# Patient Record
Sex: Female | Born: 1935 | Race: White | Hispanic: No | Smoking: Never smoker
Health system: Southern US, Community
[De-identification: ages and names within clinical notes are randomized; demographics above are authoritative.]

## PROBLEM LIST (undated history)

## (undated) DIAGNOSIS — N63 Unspecified lump in unspecified breast: Secondary | ICD-10-CM

## (undated) DIAGNOSIS — K449 Diaphragmatic hernia without obstruction or gangrene: Secondary | ICD-10-CM

## (undated) DIAGNOSIS — R12 Heartburn: Secondary | ICD-10-CM

## (undated) DIAGNOSIS — I509 Heart failure, unspecified: Secondary | ICD-10-CM

## (undated) DIAGNOSIS — M1990 Unspecified osteoarthritis, unspecified site: Secondary | ICD-10-CM

## (undated) DIAGNOSIS — I10 Essential (primary) hypertension: Secondary | ICD-10-CM

## (undated) DIAGNOSIS — C50919 Malignant neoplasm of unspecified site of unspecified female breast: Secondary | ICD-10-CM

## (undated) HISTORY — DX: Essential (primary) hypertension: I10

## (undated) HISTORY — PX: CHOLECYSTECTOMY: SHX55

## (undated) HISTORY — DX: Heartburn: R12

## (undated) HISTORY — DX: Unspecified osteoarthritis, unspecified site: M19.90

---

## 1978-12-31 HISTORY — PX: BREAST BIOPSY: SHX20

## 2004-05-09 ENCOUNTER — Ambulatory Visit: Payer: Self-pay | Admitting: Family Medicine

## 2005-07-25 ENCOUNTER — Ambulatory Visit: Payer: Self-pay | Admitting: Family Medicine

## 2006-07-31 ENCOUNTER — Ambulatory Visit: Payer: Self-pay | Admitting: Family Medicine

## 2007-05-02 DIAGNOSIS — C50919 Malignant neoplasm of unspecified site of unspecified female breast: Secondary | ICD-10-CM

## 2007-05-02 HISTORY — PX: MASTECTOMY: SHX3

## 2007-05-02 HISTORY — DX: Malignant neoplasm of unspecified site of unspecified female breast: C50.919

## 2007-11-12 ENCOUNTER — Ambulatory Visit: Payer: Self-pay | Admitting: Family Medicine

## 2007-11-30 ENCOUNTER — Ambulatory Visit: Payer: Self-pay | Admitting: Oncology

## 2007-12-09 ENCOUNTER — Ambulatory Visit: Payer: Self-pay | Admitting: Oncology

## 2007-12-13 ENCOUNTER — Ambulatory Visit: Payer: Self-pay | Admitting: Surgery

## 2007-12-13 ENCOUNTER — Other Ambulatory Visit: Payer: Self-pay

## 2007-12-17 ENCOUNTER — Inpatient Hospital Stay: Payer: Self-pay | Admitting: Surgery

## 2007-12-27 ENCOUNTER — Ambulatory Visit: Payer: Self-pay | Admitting: Oncology

## 2007-12-31 ENCOUNTER — Ambulatory Visit: Payer: Self-pay | Admitting: Oncology

## 2008-01-30 ENCOUNTER — Ambulatory Visit: Payer: Self-pay | Admitting: Oncology

## 2008-03-01 ENCOUNTER — Ambulatory Visit: Payer: Self-pay | Admitting: Oncology

## 2008-03-24 ENCOUNTER — Ambulatory Visit: Payer: Self-pay | Admitting: Oncology

## 2008-03-31 ENCOUNTER — Ambulatory Visit: Payer: Self-pay | Admitting: Oncology

## 2008-05-08 ENCOUNTER — Ambulatory Visit: Payer: Self-pay | Admitting: Oncology

## 2008-06-16 ENCOUNTER — Ambulatory Visit: Payer: Self-pay | Admitting: Oncology

## 2008-06-29 ENCOUNTER — Ambulatory Visit: Payer: Self-pay | Admitting: Oncology

## 2008-11-29 ENCOUNTER — Ambulatory Visit: Payer: Self-pay | Admitting: Oncology

## 2008-12-01 ENCOUNTER — Ambulatory Visit: Payer: Self-pay | Admitting: Oncology

## 2008-12-30 ENCOUNTER — Ambulatory Visit: Payer: Self-pay | Admitting: Oncology

## 2009-05-01 ENCOUNTER — Ambulatory Visit: Payer: Self-pay | Admitting: Oncology

## 2009-05-03 ENCOUNTER — Ambulatory Visit: Payer: Self-pay | Admitting: Oncology

## 2009-06-01 ENCOUNTER — Ambulatory Visit: Payer: Self-pay | Admitting: Oncology

## 2009-06-29 ENCOUNTER — Ambulatory Visit: Payer: Self-pay | Admitting: Oncology

## 2009-10-25 ENCOUNTER — Ambulatory Visit: Payer: Self-pay

## 2009-11-29 ENCOUNTER — Ambulatory Visit: Payer: Self-pay | Admitting: Oncology

## 2009-11-30 ENCOUNTER — Ambulatory Visit: Payer: Self-pay | Admitting: Oncology

## 2009-12-02 LAB — CANCER ANTIGEN 27.29: CA 27.29: 31.5 U/mL (ref 0.0–38.6)

## 2009-12-30 ENCOUNTER — Ambulatory Visit: Payer: Self-pay | Admitting: Oncology

## 2010-06-01 ENCOUNTER — Ambulatory Visit: Payer: Self-pay | Admitting: Oncology

## 2010-06-22 ENCOUNTER — Ambulatory Visit: Payer: Self-pay | Admitting: Oncology

## 2010-06-30 ENCOUNTER — Ambulatory Visit: Payer: Self-pay | Admitting: Oncology

## 2010-07-31 ENCOUNTER — Ambulatory Visit: Payer: Self-pay | Admitting: Oncology

## 2010-10-25 ENCOUNTER — Ambulatory Visit: Payer: Self-pay | Admitting: Oncology

## 2010-10-30 ENCOUNTER — Ambulatory Visit: Payer: Self-pay | Admitting: Internal Medicine

## 2010-10-30 ENCOUNTER — Ambulatory Visit: Payer: Self-pay | Admitting: Oncology

## 2010-11-30 ENCOUNTER — Ambulatory Visit: Payer: Self-pay | Admitting: Oncology

## 2010-11-30 ENCOUNTER — Ambulatory Visit: Payer: Self-pay | Admitting: Internal Medicine

## 2011-01-04 ENCOUNTER — Ambulatory Visit: Payer: Self-pay | Admitting: Oncology

## 2011-01-16 ENCOUNTER — Emergency Department: Payer: Self-pay | Admitting: Emergency Medicine

## 2011-01-30 ENCOUNTER — Ambulatory Visit: Payer: Self-pay | Admitting: Oncology

## 2011-04-01 ENCOUNTER — Emergency Department: Payer: Self-pay | Admitting: *Deleted

## 2011-04-14 ENCOUNTER — Ambulatory Visit: Payer: Self-pay | Admitting: Oncology

## 2011-04-15 LAB — CANCER ANTIGEN 27.29: CA 27.29: 42.5 U/mL — ABNORMAL HIGH (ref 0.0–38.6)

## 2011-05-02 ENCOUNTER — Ambulatory Visit: Payer: Self-pay | Admitting: Oncology

## 2011-07-07 ENCOUNTER — Ambulatory Visit: Payer: Self-pay | Admitting: Oncology

## 2011-08-04 ENCOUNTER — Ambulatory Visit: Payer: Self-pay | Admitting: Oncology

## 2011-08-15 LAB — RETICULOCYTES: Absolute Retic Count: 0.0504 10*6/uL (ref 0.024–0.084)

## 2011-08-15 LAB — IRON AND TIBC
Iron Saturation: 6 %
Iron: 32 ug/dL — ABNORMAL LOW (ref 50–170)
Unbound Iron-Bind.Cap.: 484 ug/dL

## 2011-08-30 ENCOUNTER — Ambulatory Visit: Payer: Self-pay | Admitting: Oncology

## 2011-10-13 ENCOUNTER — Ambulatory Visit: Payer: Self-pay | Admitting: Oncology

## 2011-11-03 ENCOUNTER — Ambulatory Visit: Payer: Self-pay | Admitting: Oncology

## 2011-11-03 LAB — CBC CANCER CENTER
Basophil #: 0 x10 3/mm (ref 0.0–0.1)
Basophil %: 0.6 %
Eosinophil #: 0.2 x10 3/mm (ref 0.0–0.7)
HCT: 35.2 % (ref 35.0–47.0)
HGB: 11 g/dL — ABNORMAL LOW (ref 12.0–16.0)
Lymphocyte %: 25.8 %
MCH: 23.9 pg — ABNORMAL LOW (ref 26.0–34.0)
MCHC: 31.2 g/dL — ABNORMAL LOW (ref 32.0–36.0)
Monocyte #: 0.4 x10 3/mm (ref 0.2–0.9)
Monocyte %: 6.7 %
Neutrophil #: 3.9 x10 3/mm (ref 1.4–6.5)
Neutrophil %: 63.2 %
Platelet: 295 x10 3/mm (ref 150–440)
RBC: 4.59 10*6/uL (ref 3.80–5.20)
RDW: 19.4 % — ABNORMAL HIGH (ref 11.5–14.5)
WBC: 6.1 x10 3/mm (ref 3.6–11.0)

## 2011-11-03 LAB — COMPREHENSIVE METABOLIC PANEL
Anion Gap: 10 (ref 7–16)
Calcium, Total: 9.5 mg/dL (ref 8.5–10.1)
Chloride: 104 mmol/L (ref 98–107)
EGFR (African American): >60
EGFR (Non-African Amer.): >60
Potassium: 3.3 mmol/L — ABNORMAL LOW (ref 3.5–5.1)
SGOT(AST): 23 U/L (ref 15–37)
Total Protein: 7.3 g/dL (ref 6.4–8.2)

## 2011-11-30 ENCOUNTER — Ambulatory Visit: Payer: Self-pay | Admitting: Oncology

## 2012-03-01 ENCOUNTER — Ambulatory Visit: Payer: Self-pay | Admitting: Oncology

## 2012-03-01 LAB — CBC CANCER CENTER
Basophil %: 0.7 %
Eosinophil #: 0.1 x10 3/mm (ref 0.0–0.7)
Eosinophil %: 1.3 %
HCT: 35.5 % (ref 35.0–47.0)
HGB: 11.5 g/dL — ABNORMAL LOW (ref 12.0–16.0)
Lymphocyte #: 2 x10 3/mm (ref 1.0–3.6)
Lymphocyte %: 30.3 %
MCH: 25.5 pg — ABNORMAL LOW (ref 26.0–34.0)
MCHC: 32.3 g/dL (ref 32.0–36.0)
MCV: 79 fL — ABNORMAL LOW (ref 80–100)
Monocyte #: 0.5 x10 3/mm (ref 0.2–0.9)
Neutrophil #: 4 x10 3/mm (ref 1.4–6.5)
Neutrophil %: 60 %
Platelet: 298 x10 3/mm (ref 150–440)
RBC: 4.5 10*6/uL (ref 3.80–5.20)
RDW: 19 % — ABNORMAL HIGH (ref 11.5–14.5)
WBC: 6.6 x10 3/mm (ref 3.6–11.0)

## 2012-03-01 LAB — COMPREHENSIVE METABOLIC PANEL
Albumin: 4.3 g/dL (ref 3.4–5.0)
Alkaline Phosphatase: 124 U/L (ref 50–136)
Anion Gap: 12 (ref 7–16)
Calcium, Total: 9.9 mg/dL (ref 8.5–10.1)
Chloride: 103 mmol/L (ref 98–107)
Co2: 28 mmol/L (ref 21–32)
Creatinine: 0.85 mg/dL (ref 0.60–1.30)
EGFR (African American): >60
EGFR (Non-African Amer.): >60
Potassium: 3.5 mmol/L (ref 3.5–5.1)
SGOT(AST): 22 U/L (ref 15–37)
SGPT (ALT): 25 U/L (ref 12–78)
Sodium: 143 mmol/L (ref 136–145)
Total Protein: 7.7 g/dL (ref 6.4–8.2)

## 2012-03-04 LAB — CANCER ANTIGEN 27.29: CA 27.29: 38 U/mL (ref 0.0–38.6)

## 2012-03-31 ENCOUNTER — Ambulatory Visit: Payer: Self-pay | Admitting: Oncology

## 2012-05-31 ENCOUNTER — Ambulatory Visit: Payer: Self-pay | Admitting: Oncology

## 2012-05-31 LAB — COMPREHENSIVE METABOLIC PANEL
Alkaline Phosphatase: 103 U/L (ref 50–136)
Chloride: 104 mmol/L (ref 98–107)
Co2: 27 mmol/L (ref 21–32)
Creatinine: 0.9 mg/dL (ref 0.60–1.30)
EGFR (African American): >60
EGFR (Non-African Amer.): 57 — ABNORMAL LOW
Glucose: 153 mg/dL — ABNORMAL HIGH (ref 65–99)
Potassium: 3.4 mmol/L — ABNORMAL LOW (ref 3.5–5.1)
SGPT (ALT): 21 U/L (ref 12–78)
Sodium: 144 mmol/L (ref 136–145)

## 2012-05-31 LAB — CBC CANCER CENTER
Basophil %: 0.6 %
Eosinophil #: 0.1 x10 3/mm (ref 0.0–0.7)
MCH: 23.5 pg — ABNORMAL LOW (ref 26.0–34.0)
MCHC: 31.6 g/dL — ABNORMAL LOW (ref 32.0–36.0)
MCV: 74 fL — ABNORMAL LOW (ref 80–100)
Monocyte #: 0.5 x10 3/mm (ref 0.2–0.9)
Neutrophil #: 5.1 x10 3/mm (ref 1.4–6.5)
Neutrophil %: 67.8 %
Platelet: 298 x10 3/mm (ref 150–440)
RBC: 4.61 10*6/uL (ref 3.80–5.20)
RDW: 17.5 % — ABNORMAL HIGH (ref 11.5–14.5)

## 2012-06-01 ENCOUNTER — Ambulatory Visit: Payer: Self-pay | Admitting: Oncology

## 2012-07-26 ENCOUNTER — Ambulatory Visit: Payer: Self-pay | Admitting: Oncology

## 2012-07-26 LAB — CBC CANCER CENTER
Basophil #: 0 x10 3/mm (ref 0.0–0.1)
Basophil %: 0.5 %
Eosinophil %: 2.1 %
HGB: 10.9 g/dL — ABNORMAL LOW (ref 12.0–16.0)
Lymphocyte #: 1.8 x10 3/mm (ref 1.0–3.6)
Lymphocyte %: 22.7 %
MCH: 23 pg — ABNORMAL LOW (ref 26.0–34.0)
MCV: 74 fL — ABNORMAL LOW (ref 80–100)
Monocyte #: 0.6 x10 3/mm (ref 0.2–0.9)
Neutrophil #: 5.3 x10 3/mm (ref 1.4–6.5)
Neutrophil %: 67.5 %
RDW: 19.4 % — ABNORMAL HIGH (ref 11.5–14.5)

## 2012-07-26 LAB — COMPREHENSIVE METABOLIC PANEL
Albumin: 3.6 g/dL (ref 3.4–5.0)
Alkaline Phosphatase: 119 U/L (ref 50–136)
Calcium, Total: 9.4 mg/dL (ref 8.5–10.1)
Chloride: 105 mmol/L (ref 98–107)
Co2: 29 mmol/L (ref 21–32)
EGFR (African American): >60
EGFR (Non-African Amer.): >60
Glucose: 149 mg/dL — ABNORMAL HIGH (ref 65–99)
Osmolality: 294 (ref 275–301)
Potassium: 3.2 mmol/L — ABNORMAL LOW (ref 3.5–5.1)
Total Protein: 7.4 g/dL (ref 6.4–8.2)

## 2012-07-29 LAB — CANCER ANTIGEN 27.29: CA 27.29: 32.5 U/mL (ref 0.0–38.6)

## 2012-07-30 ENCOUNTER — Ambulatory Visit: Payer: Self-pay | Admitting: Oncology

## 2012-08-02 ENCOUNTER — Ambulatory Visit: Payer: Self-pay | Admitting: Oncology

## 2012-08-03 ENCOUNTER — Ambulatory Visit: Payer: Self-pay | Admitting: Oncology

## 2013-02-21 ENCOUNTER — Ambulatory Visit: Payer: Self-pay | Admitting: Oncology

## 2013-02-21 LAB — CBC CANCER CENTER
Basophil #: 0 x10 3/mm (ref 0.0–0.1)
Basophil %: 0.8 %
Eosinophil #: 0.1 x10 3/mm (ref 0.0–0.7)
Eosinophil %: 1.3 %
HCT: 30.1 % — ABNORMAL LOW (ref 35.0–47.0)
HGB: 9.2 g/dL — ABNORMAL LOW (ref 12.0–16.0)
Lymphocyte %: 24 %
MCV: 71 fL — ABNORMAL LOW (ref 80–100)
Monocyte #: 0.5 x10 3/mm (ref 0.2–0.9)
Neutrophil %: 64.7 %
RDW: 18.9 % — ABNORMAL HIGH (ref 11.5–14.5)
WBC: 5.9 x10 3/mm (ref 3.6–11.0)

## 2013-02-21 LAB — COMPREHENSIVE METABOLIC PANEL
Albumin: 3.8 g/dL (ref 3.4–5.0)
Alkaline Phosphatase: 131 U/L (ref 50–136)
Bilirubin,Total: 0.3 mg/dL (ref 0.2–1.0)
Calcium, Total: 9.2 mg/dL (ref 8.5–10.1)
Chloride: 103 mmol/L (ref 98–107)
Co2: 27 mmol/L (ref 21–32)
Creatinine: 0.97 mg/dL (ref 0.60–1.30)
EGFR (Non-African Amer.): 51 — ABNORMAL LOW
Glucose: 159 mg/dL — ABNORMAL HIGH (ref 65–99)
Potassium: 3.2 mmol/L — ABNORMAL LOW (ref 3.5–5.1)
SGOT(AST): 19 U/L (ref 15–37)
SGPT (ALT): 26 U/L (ref 12–78)
Sodium: 144 mmol/L (ref 136–145)
Total Protein: 7.4 g/dL (ref 6.4–8.2)

## 2013-03-01 ENCOUNTER — Ambulatory Visit: Payer: Self-pay | Admitting: Oncology

## 2013-07-11 ENCOUNTER — Inpatient Hospital Stay: Payer: Self-pay | Admitting: Internal Medicine

## 2013-07-11 LAB — IRON AND TIBC
Iron Bind.Cap.(Total): 540 ug/dL — ABNORMAL HIGH (ref 250–450)
Iron Saturation: 2 %
Iron: 13 ug/dL — ABNORMAL LOW (ref 50–170)
UNBOUND IRON-BIND. CAP.: 527 ug/dL

## 2013-07-11 LAB — CBC WITH DIFFERENTIAL/PLATELET
Basophil #: 0 10*3/uL (ref 0.0–0.1)
Basophil %: 0.6 %
EOS ABS: 0.1 10*3/uL (ref 0.0–0.7)
Eosinophil %: 1 %
HCT: 26 % — ABNORMAL LOW (ref 35.0–47.0)
HGB: 7.7 g/dL — ABNORMAL LOW (ref 12.0–16.0)
LYMPHS ABS: 1.9 10*3/uL (ref 1.0–3.6)
LYMPHS PCT: 23.1 %
MCH: 20 pg — AB (ref 26.0–34.0)
MCHC: 29.5 g/dL — AB (ref 32.0–36.0)
MCV: 68 fL — AB (ref 80–100)
MONO ABS: 0.6 x10 3/mm (ref 0.2–0.9)
Monocyte %: 7.1 %
NEUTROS ABS: 5.5 10*3/uL (ref 1.4–6.5)
NEUTROS PCT: 68.2 %
PLATELETS: 394 10*3/uL (ref 150–440)
RBC: 3.83 10*6/uL (ref 3.80–5.20)
RDW: 20.5 % — ABNORMAL HIGH (ref 11.5–14.5)
WBC: 8.1 10*3/uL (ref 3.6–11.0)

## 2013-07-11 LAB — BASIC METABOLIC PANEL
ANION GAP: 7 (ref 7–16)
BUN: 28 mg/dL — ABNORMAL HIGH (ref 7–18)
CO2: 26 mmol/L (ref 21–32)
Calcium, Total: 9.6 mg/dL (ref 8.5–10.1)
Chloride: 109 mmol/L — ABNORMAL HIGH (ref 98–107)
Creatinine: 0.8 mg/dL (ref 0.60–1.30)
GLUCOSE: 129 mg/dL — AB (ref 65–99)
OSMOLALITY: 290 (ref 275–301)
POTASSIUM: 3.7 mmol/L (ref 3.5–5.1)
Sodium: 142 mmol/L (ref 136–145)

## 2013-07-11 LAB — FERRITIN: Ferritin (ARMC): 3 ng/mL — ABNORMAL LOW (ref 8–388)

## 2013-07-12 LAB — BASIC METABOLIC PANEL
Anion Gap: 5 — ABNORMAL LOW (ref 7–16)
BUN: 21 mg/dL — AB (ref 7–18)
CALCIUM: 8.6 mg/dL (ref 8.5–10.1)
CO2: 27 mmol/L (ref 21–32)
Chloride: 111 mmol/L — ABNORMAL HIGH (ref 98–107)
Creatinine: 0.66 mg/dL (ref 0.60–1.30)
Glucose: 76 mg/dL (ref 65–99)
OSMOLALITY: 287 (ref 275–301)
POTASSIUM: 3.3 mmol/L — AB (ref 3.5–5.1)
Sodium: 143 mmol/L (ref 136–145)

## 2013-07-12 LAB — CBC WITH DIFFERENTIAL/PLATELET
Basophil #: 0 10*3/uL (ref 0.0–0.1)
Basophil %: 0.7 %
Eosinophil #: 0.3 10*3/uL (ref 0.0–0.7)
Eosinophil %: 5.5 %
HCT: 21.2 % — ABNORMAL LOW (ref 35.0–47.0)
HGB: 6.7 g/dL — ABNORMAL LOW (ref 12.0–16.0)
LYMPHS PCT: 29.5 %
Lymphocyte #: 1.4 10*3/uL (ref 1.0–3.6)
MCH: 21.1 pg — ABNORMAL LOW (ref 26.0–34.0)
MCHC: 31.6 g/dL — ABNORMAL LOW (ref 32.0–36.0)
MCV: 67 fL — ABNORMAL LOW (ref 80–100)
MONOS PCT: 8.7 %
Monocyte #: 0.4 x10 3/mm (ref 0.2–0.9)
NEUTROS ABS: 2.7 10*3/uL (ref 1.4–6.5)
Neutrophil %: 55.6 %
Platelet: 295 10*3/uL (ref 150–440)
RBC: 3.18 10*6/uL — AB (ref 3.80–5.20)
RDW: 20.4 % — ABNORMAL HIGH (ref 11.5–14.5)
WBC: 4.9 10*3/uL (ref 3.6–11.0)

## 2013-07-12 LAB — HEMOGLOBIN: HGB: 10.3 g/dL — AB (ref 12.0–16.0)

## 2013-07-13 LAB — HEMOGLOBIN: HGB: 11.2 g/dL — AB (ref 12.0–16.0)

## 2013-07-14 LAB — HEMOGLOBIN: HGB: 10.8 g/dL — ABNORMAL LOW (ref 12.0–16.0)

## 2013-07-16 LAB — PATHOLOGY REPORT

## 2013-08-14 ENCOUNTER — Ambulatory Visit: Payer: Self-pay | Admitting: Oncology

## 2014-02-27 ENCOUNTER — Ambulatory Visit: Payer: Self-pay | Admitting: Oncology

## 2014-03-01 ENCOUNTER — Ambulatory Visit: Payer: Self-pay | Admitting: Oncology

## 2014-06-30 DIAGNOSIS — N63 Unspecified lump in unspecified breast: Secondary | ICD-10-CM | POA: Insufficient documentation

## 2014-06-30 HISTORY — DX: Unspecified lump in unspecified breast: N63.0

## 2014-08-19 ENCOUNTER — Ambulatory Visit
Admit: 2014-08-19 | Disposition: A | Discharge status: Home / Self Care | Payer: Self-pay | Attending: Oncology | Admitting: Oncology

## 2014-08-21 ENCOUNTER — Other Ambulatory Visit: Payer: Self-pay | Admitting: Oncology

## 2014-08-22 NOTE — Discharge Summary (Signed)
PATIENT NAME:  Krista Randall, Krista Randall MR#:  [id number] DATE OF BIRTH:  1921-04-22  DATE OF ADMISSION:  07/11/2013 DATE OF DISCHARGE:  07/14/2013  DISCHARGE DIAGNOSES:  1. Reflux esophagitis.  2. Large hiatal hernia.  3. Acute blood loss anemia.  4. Hypertension.  5. Diabetes mellitus.   DISCHARGE MEDICATIONS:  1. Flonase 2 sprays nasal once a day.  2. Levothyroxine 50 mcg daily.  3. Latanoprost 1 drop to each affected eye once a day.  4. Fosinopril 40 mg daily.  5. Amlodipine 10 mg daily.  6. Metformin extended release 500 mg daily.  7. Tylenol 500 mg 2 tablets every 8 hours as needed.  8. Clotrimazole 10 mg oral 5 times a day.  9. Probiotic formula oral capsule daily.  10. Protonix 40 mg 2 times a day.  11. Ferrous sulfate 325 mg oral 2 times a day.   DISCHARGE INSTRUCTIONS: Low-sodium, carbohydrate-controlled diet. Activity as tolerated.   FOLLOWUP: With primary care physician in 1 to 2 weeks.   ADMITTING HISTORY AND PHYSICAL AND HOSPITAL COURSE: Please see detailed H and P dictated previously, but in brief, a 81 year old Caucasian female patient who presented to the hospital complaining of melena, coffee-ground emesis for a week. The patient was found to have acute anemia and with GI bleed. Admitted to hospitalist service. The patient had an EGD done which showed reflux esophagitis. Prior to her EGD, with hemoglobin low at 6.7, she did receive 2 units of packed RBCs, with which her hemoglobin has come up and is stable at 10.8 by day of discharge. The patient has had a bowel movement with normal color. No blood. No melena. Cleared by GI for discharge and is being sent home with Protonix.   Prior to discharge, the patient has no abdominal tenderness. Bowel sounds are normal.   DISCHARGE TIME SPENT TODAY: 40 minutes.    ____________________________ Leia Alf Halleigh Comes, MD srs:gb D: 07/14/2013 14:53:48 ET T: 07/15/2013 00:05:13 ET JOB#: [id number]  cc: Alveta Heimlich R. Divinity Kyler, MD, <Dictator> Lupita Dawn.  Candace Cruise, MD Dr. Marrian Salvage MD ELECTRONICALLY SIGNED 07/26/2013 10:24

## 2014-08-22 NOTE — Consult Note (Signed)
PATIENT NAME:  Krista Randall, Krista Randall MR#:  [id number] DATE OF BIRTH:  Jun 08, 1920  DATE OF CONSULTATION:  07/12/2013  CONSULTING PHYSICIAN:  Lupita Dawn. Graceland Wachter, MD  REASON FOR REFERRAL: Coffee-ground emesis.   DESCRIPTION: The patient is a 81 year old white female who is relatively healthy and independent who was brought in last night with significant anemia. Apparently, she had an episode of coffee-ground emesis on Tuesday. She also noticed some dark stools as well on and off this past week. She has had decreased appetite and weight loss. She has had some issues with heartburn and indigestion with lack of appetite. She did admit to taking some ibuprofen 2 to 3 times weekly for generalized aches and pain. She thought she had a normal colonoscopy 7 to 8 years ago here in the hospital. However, I do not have any records available to review.   She denies any prior history of ulcer disease. She denies any dysphagia, odynophagia, fevers or chills. Because of lack of appetite she has lost some weight. When she was admitted, her hemoglobin 7.7 last night. This morning is 6.7.   PAST MEDICAL HISTORY: Blood pressure. She denies having any heart issues, although other history includes some enlarged heart and pulmonary hypertension. Other history included history of breast cancer requiring surgery in the past. She also has hypercholesterolemia.   MEDICATIONS AT HOME: Synthroid, lisinopril. She said she was supposed to be on three blood pressure medicines and one was stopped by Dr. Nehemiah Massed who last saw her about a month ago.   ALLERGIES: She has no known drug allergies.   SOCIAL HISTORY: She denies any tobacco or alcohol use.   SOCIAL HISTORY: She lives alone and is quite independent. She drives on her.   REVIEW OF SYSTEMS: Again there is no fevers or chills, but there is some weight loss and she has been feeling somewhat weak. GI symptoms have been described already. There is no diarrhea or constipation. She did have some  episode of melena. There is some abdominal pain. The rest the review of symptoms is noncontributory.   PHYSICAL EXAMINATION: GENERAL: The patient does look somewhat pale but she is quite alert and oriented.  VITAL SIGNS: She is afebrile. Vital signs are stable. Blood pressure is slightly high.  HEAD AND NECK: Within normal limits.  CARDIAC: Regular rhythm and rate.  LUNGS: Clear bilaterally.  ABDOMEN: Normoactive bowel sounds, soft, nontender. There is no hepatomegaly, there may be slight tenderness in the mid abdomen.  EXTREMITIES: No clubbing, cyanosis, or edema.   ASSESSMENT AND PLAN: This is a patient with significant anemia with indigestion and heartburn, coffee-ground emesis as well as melena. The patient has had upper gastrointestinal bleeding. The most important thing is to give her some blood transfusions since her hemoglobin is down to 6.7 this morning. She is already placed on some Protonix IV. We need to check for ulcers or reflux disease as contributing to the bleeding. I discussed the procedure with the patient and the family who were available. We will schedule an endoscopy after she is transfused on Sunday morning.    ____________________________ Lupita Dawn. Candace Cruise, MD pyo:sg D: 07/13/2013 08:22:54 ET T: 07/13/2013 09:13:49 ET JOB#: [id number]  cc: Lupita Dawn. Candace Cruise, MD, <Dictator> Lupita Dawn Devun Anna MD ELECTRONICALLY SIGNED 07/18/2013 16:21

## 2014-08-22 NOTE — Consult Note (Signed)
Chief Complaint:  Subjective/Chief Complaint Doing well. No problems with mech soft diet. Had BM this AM which was normal in color. No more melena. Hgb stable.   VITAL SIGNS/ANCILLARY NOTES: **Vital Signs.:   16-Mar-15 05:09  Vital Signs Type Routine  Temperature Temperature (F) 98.2  Celsius 36.7  Temperature Source oral  Pulse Pulse 78  Respirations Respirations 17  Systolic BP Systolic BP 975  Diastolic BP (mmHg) Diastolic BP (mmHg) 62  Mean BP 92  BP Source  if not from Vital Sign Device manual  Pulse Ox % Pulse Ox % 97  Pulse Ox Activity Level  At rest  Oxygen Delivery Room Air/ 21 %   Brief Assessment:  GEN no acute distress   Cardiac Regular   Respiratory clear BS   Gastrointestinal Normal   Lab Results: Routine Hem:  15-Mar-15 04:35   Hemoglobin (CBC)  11.2 (Result(s) reported on 13 Jul 2013 at 05:03AM.)   Assessment/Plan:  Assessment/Plan:  Assessment Reflux esophagitis.   Plan Stable for discharge today. Keep on protonix bid x 8 wks. Can take prilosec daily afterwards. Was taking carafate at home. No need to take carafate anymore. Will sign off. Thanks.   Electronic Signatures: Verdie Shire (MD)  (Signed 16-Mar-15 07:38)  Authored: Chief Complaint, VITAL SIGNS/ANCILLARY NOTES, Brief Assessment, Lab Results, Assessment/Plan   Last Updated: 16-Mar-15 07:38 by Verdie Shire (MD)

## 2014-08-22 NOTE — Consult Note (Signed)
No further bleeding. Received 2 units of blood. EGD showed significant reflux esophagitis and large hiatal hernia. No active bleeding. Mechanical soft diet ordered. Can give protonix po bid x min 8 wks and once daily afterwards if stable. Thanks.  Electronic Signatures: Verdie Shire (MD)  (Signed on 15-Mar-15 08:12)  Authored  Last Updated: 15-Mar-15 08:12 by Verdie Shire (MD)

## 2014-08-22 NOTE — Consult Note (Signed)
Pt seen and examined. Full consult to follow. Overall healthy and independent 81 yo WF who presents with coffee ground emesis on Tues, melena, decreased appetite, and wt loss. Hgb low and dropping. Some heartburn. Ibuprofen 2-3 x weekly. Normal colonoscopy in the past? No record available. No prior PUD hx. No cardiac sxs now though somewhat SOB. Hgb less than 7 today. Request at least one unit of blood transfusion today. Clear liquid diet. Discussed EGD with patient and family. Pt willing. Will plan EGD tomorrow. Dr. Nehemiah Massed is her cardiologist. Would recommend cardiology f/u while patient in house.Thanks.   Electronic Signatures: Verdie Shire (MD) (Signed on 14-Mar-15 09:18)  Authored   Last Updated: 14-Mar-15 10:05 by Verdie Shire (MD)

## 2014-08-22 NOTE — H&P (Signed)
PATIENT NAME:  Krista Randall, Krista Randall MR#:  [id number] DATE OF BIRTH:  Aug 21, 1920  DATE OF ADMISSION:    PRIMARY CARE PHYSICIAN:  Dr. Luan Pulling  CHIEF COMPLAINT: Melena and coffee-ground emesis.  This is a very pleasant 81 year old female with a history of GERD, hypothyroidism, breast cancer, type 2 diabetes, who presented to her PCP's office complaining of midepigastric abdominal pain, coffee-ground emesis and melena for the past few days. The patient is supposed to be on Carafate, but has not taken it for about a month. She was a direct admit for these symptoms. Her hemoglobin was also noted to be 8 in the physician's office, which she says is not her baseline and is much lower than her baseline.   The patient denies taking any NSAIDs, aspirin. She takes p.r.n. NSAIDs but not on a daily basis.   REVIEW OF SYSTEMS:  CONSTITUTIONAL: No fever. Positive for fatigue. No weakness.  EYES: No blurred or double vision. ENT:  No ear pain, hearing loss.  No postnasal drip. RESPIRATORY: No cough, wheezing, hemoptysis, dyspnea.  CARDIOVASCULAR:  No chest pain, orthopnea, arrhythmia, dyspnea on exertion, palpitations or syncope.  GASTROINTESTINAL:  Positive nausea with coffee-ground emesis and positive melena. Positive GERD. ENDOCRINE: No polyuria or polydipsia. HEMATOLOGIC AND LYMPHATICS:  No bleeding or swollen glands.  SKIN: No rash or lesions. MUSCULOSKELETAL: No limited activity. NEUROLOGIC: No CVA, TIA or seizures. PSYCHIATRIC: No history of anxiety or depression.  PAST MEDICAL HISTORY:   1.  Unstable balance.  2.  Hypertension.  3.  Heartburn. 4.  Arthritis.  5.  Seasonal allergies. 6.  Hypothyroidism. 7.  Left breast cancer, status post mastectomy.  8.  Type 2 diabetes.  9.  Hyperlipidemia.  FAMILY HISTORY:  Positive for CVA, cancer and heart disease.   SOCIAL HISTORY: No tobacco, alcohol or IV drug use.   ALLERGIES:  No known drug allergies.   PAST SURGICAL HISTORY:  1.  Left breast  mastectomy for breast cancer.  2.  Cholecystectomy.  3.  Tonsillectomy.  MEDICATIONS:   1.  Fosinopril 40 mg daily.  2.  Synthroid 50 mcg daily. 3.  Flonase 1 to 2 nasally each day. 4.  Prilosec 20 mg daily.  5.  Norvasc 10 mg daily.  6.  Metformin 500 mg every other day.  7.  Tylenol Extra Strength p.r.n. pain 1 to 2 tablets t.i.d. 500 mg.  8.  Clotrimazole 1 lozenge 5 times a day.  9.  Digestive enzyme daily.  10.  Latanoprost 1 drop each eye at bedtime.   PHYSICAL EXAMINATION: VITAL SIGNS: Temperature 98. Pulse is 101, respirations 20, blood pressure 145/78.  GENERAL:  The patient is alert, oriented, not in acute distress.  HEENT:  Head is atraumatic.  Pupils are round.  Anicteric sclerae.  Mucous membranes are moist.  Oropharynx clear.  NECK: Supple without JVD, carotid bruit or enlarged thyroid.  CARDIOVASCULAR: Tachycardia. No murmurs, gallops or rubs. PMI is not displaced. LUNGS: Clear to auscultation without crackles, rales, rhonchi  or wheezing. Normal percussion. ABDOMEN:  Bowel sounds positive. Nontender,  nondistended. No rebound or guarding.  EXTREMITIES: No clubbing, cyanosis or edema. NEUROLOGIC:  Cranial nerves II through XII are grossly intact.  There is no focal deficit. MUSCULOSKELETAL: 5/5 strength in all extremities. SKIN:  Without rash or lesions. RECTAL:  Exam per Dr. Luan Pulling, her primary care physician, was positive.  LABORATORY DATA:  At his office, hemoglobin was 8, which again, he says is much lower than her baseline.  ASSESSMENT AND PLAN: This is a very pleasant 81 year old female who presented to her PCP's office with melena and coffee-ground emesis for the past 3 days with guaiac-positive stool. 1.  Upper gastrointestinal bleed. The patient has abdominal pain, melena, coffee-ground emesis. This is likely upper GI bleed, probably from a duodenal gastric ulcer. The patient is on a PPI IV b.i.d. GI has been consulted. The patient is on a clear liquid diet  for now and n.p.o. after midnight for possible EGD in the a.m.  We will continue to monitor. I have ordered a stat hemoglobin at this time. With a hemoglobin of 8, I probable would not transfuse, but if it is much lower, then we will transfuse 1 unit of PRBCs or depending on the next hemoglobin. The patient has consented for blood transfusion.  2.  Anemia, acute onset, secondary to upper GASTROINTESTINAL bleed. As mentioned, the patient has consented for a blood transfusion if needed. Risks, alternatives, benefits and risks were discussed with the patient.  3.  Hypertension. At this time, the patient's blood pressure is elevated, so we will continue an ACE inhibitor. We do not have fosinopril, so I will use lisinopril instead.  4.  Diabetes. The patient is on sliding scale insulin. We will hold her outpatient medications.  5.  Hypothyroidism. We will continue Synthroid.   Please patient is a DNR status.   TIME SPENT:  Approximately 40 minutes.     ____________________________ Donell Beers. Benjie Karvonen, MD spm:dmm D: 07/11/2013 16:52:00 ET T: 07/11/2013 19:03:59 ET JOB#: [id number]  cc: Tezra Mahr P. Benjie Karvonen, MD, <Dictator> Dr. Dorise Hiss P Corbett Moulder MD ELECTRONICALLY SIGNED 07/17/2013 21:30

## 2014-08-24 ENCOUNTER — Other Ambulatory Visit: Payer: Self-pay | Admitting: Oncology

## 2014-08-24 DIAGNOSIS — N63 Unspecified lump in unspecified breast: Secondary | ICD-10-CM

## 2014-08-25 ENCOUNTER — Other Ambulatory Visit: Payer: Self-pay | Admitting: Surgery

## 2014-08-25 ENCOUNTER — Other Ambulatory Visit: Payer: Self-pay | Admitting: Oncology

## 2014-08-25 DIAGNOSIS — N63 Unspecified lump in unspecified breast: Secondary | ICD-10-CM

## 2014-08-25 DIAGNOSIS — Z853 Personal history of malignant neoplasm of breast: Secondary | ICD-10-CM

## 2014-08-25 DIAGNOSIS — N6310 Unspecified lump in the right breast, unspecified quadrant: Secondary | ICD-10-CM

## 2014-08-26 ENCOUNTER — Other Ambulatory Visit: Payer: Self-pay | Admitting: Oncology

## 2014-08-26 DIAGNOSIS — N6310 Unspecified lump in the right breast, unspecified quadrant: Secondary | ICD-10-CM

## 2014-08-26 DIAGNOSIS — Z853 Personal history of malignant neoplasm of breast: Secondary | ICD-10-CM

## 2014-09-03 ENCOUNTER — Ambulatory Visit
Admission: RE | Admit: 2014-09-03 | Discharge: 2014-09-03 | Disposition: A | Discharge status: Home / Self Care | Payer: Medicare Other | Source: Ambulatory Visit | Attending: Oncology | Admitting: Oncology

## 2014-09-03 ENCOUNTER — Other Ambulatory Visit: Payer: Self-pay | Admitting: Oncology

## 2014-09-03 DIAGNOSIS — Z853 Personal history of malignant neoplasm of breast: Secondary | ICD-10-CM | POA: Diagnosis present

## 2014-09-03 DIAGNOSIS — N63 Unspecified lump in breast: Secondary | ICD-10-CM | POA: Insufficient documentation

## 2014-09-03 DIAGNOSIS — N6310 Unspecified lump in the right breast, unspecified quadrant: Secondary | ICD-10-CM

## 2014-09-03 HISTORY — DX: Malignant neoplasm of unspecified site of unspecified female breast: C50.919

## 2014-09-03 HISTORY — DX: Unspecified lump in unspecified breast: N63.0

## 2014-11-25 ENCOUNTER — Other Ambulatory Visit: Payer: Self-pay | Admitting: Family Medicine

## 2014-11-25 MED ORDER — PANTOPRAZOLE SODIUM 40 MG PO TBEC: 40.0000 mg | DELAYED_RELEASE_TABLET | Freq: Two times a day (BID) | ORAL | Status: DC

## 2014-11-30 ENCOUNTER — Other Ambulatory Visit: Payer: Self-pay | Admitting: Family Medicine

## 2014-11-30 MED ORDER — PANTOPRAZOLE SODIUM 40 MG PO TBEC: 40.0000 mg | DELAYED_RELEASE_TABLET | Freq: Two times a day (BID) | ORAL | Status: DC

## 2014-12-22 ENCOUNTER — Ambulatory Visit
Admission: RE | Admit: 2014-12-22 | Discharge: 2014-12-22 | Disposition: A | Discharge status: Home / Self Care | Payer: Medicare Other | Source: Ambulatory Visit | Attending: Family Medicine | Admitting: Family Medicine

## 2014-12-22 ENCOUNTER — Encounter: Payer: Self-pay | Admitting: Family Medicine

## 2014-12-22 ENCOUNTER — Ambulatory Visit (INDEPENDENT_AMBULATORY_CARE_PROVIDER_SITE_OTHER): Payer: Medicare Other | Admitting: Family Medicine

## 2014-12-22 VITALS — BP 155/60 | HR 72 | Temp 98.2°F | Resp 16 | Ht <= 58 in | Wt 104.0 lb

## 2014-12-22 DIAGNOSIS — K449 Diaphragmatic hernia without obstruction or gangrene: Secondary | ICD-10-CM | POA: Insufficient documentation

## 2014-12-22 DIAGNOSIS — I10 Essential (primary) hypertension: Secondary | ICD-10-CM | POA: Diagnosis not present

## 2014-12-22 DIAGNOSIS — R5382 Chronic fatigue, unspecified: Secondary | ICD-10-CM

## 2014-12-22 DIAGNOSIS — E119 Type 2 diabetes mellitus without complications: Secondary | ICD-10-CM | POA: Diagnosis not present

## 2014-12-22 DIAGNOSIS — R0609 Other forms of dyspnea: Secondary | ICD-10-CM | POA: Diagnosis present

## 2014-12-22 DIAGNOSIS — E038 Other specified hypothyroidism: Secondary | ICD-10-CM

## 2014-12-22 DIAGNOSIS — K219 Gastro-esophageal reflux disease without esophagitis: Secondary | ICD-10-CM

## 2014-12-22 DIAGNOSIS — J449 Chronic obstructive pulmonary disease, unspecified: Secondary | ICD-10-CM | POA: Diagnosis not present

## 2014-12-22 DIAGNOSIS — D500 Iron deficiency anemia secondary to blood loss (chronic): Secondary | ICD-10-CM

## 2014-12-22 NOTE — Progress Notes (Signed)
Name: Krista Randall   MRN: [id number]    DOB: 11/02/1920   Date:12/22/2014       Progress Note  Subjective  Chief Complaint  Chief Complaint  Patient presents with  . Follow-up    pt is having swelling in both feet at night; pt states bs are running 150s. Pt has noticed sob with exertion.  . Hypertension  . Diabetes    HPI For f/u of HBP.  Has diet controlled DM, but BSs at home running from 150-200 usually.  C/o Dyspnea on exertion and feet swelling, esp. At night.   Her chronic dizziness is unchanged. No problem-specific assessment & plan notes found for this encounter.   Past Medical History  Diagnosis Date  . Breast cancer 2009    left breast  . Breast mass march 2016    rt lump @ 9 0C NKI     Social History  Substance Use Topics  . Smoking status: Not on file  . Smokeless tobacco: Not on file  . Alcohol Use: Not on file     Current outpatient prescriptions:  .  amLODipine (NORVASC) 10 MG tablet, Take 10 mg by mouth daily., Disp: , Rfl: 11 .  fluticasone (FLONASE) 50 MCG/ACT nasal spray, Place 1 spray into both nostrils daily as needed., Disp: , Rfl: 11 .  fosinopril (MONOPRIL) 40 MG tablet, Take 40 mg by mouth daily., Disp: , Rfl: 3 .  hydrochlorothiazide (MICROZIDE) 12.5 MG capsule, Take 12.5 mg by mouth daily., Disp: , Rfl: 5 .  latanoprost (XALATAN) 0.005 % ophthalmic solution, Place 1 drop into both eyes daily., Disp: , Rfl: 4 .  levothyroxine (SYNTHROID, LEVOTHROID) 75 MCG tablet, Take 75 mcg by mouth daily., Disp: , Rfl: 5 .  pantoprazole (PROTONIX) 40 MG tablet, Take 1 tablet (40 mg total) by mouth 2 (two) times daily., Disp: 60 tablet, Rfl: 11 .  potassium chloride (K-DUR) 10 MEQ tablet, Take 10 mEq by mouth daily., Disp: , Rfl: 5  No Known Allergies  Review of Systems  Constitutional: Positive for malaise/fatigue. Negative for fever and chills.  HENT: Negative for hearing loss.   Eyes: Negative for blurred vision and double vision.  Respiratory:  Positive for shortness of breath. Negative for cough, sputum production and wheezing.   Cardiovascular: Positive for leg swelling. Negative for chest pain and palpitations.  Gastrointestinal: Negative for heartburn, nausea, vomiting, abdominal pain, diarrhea and blood in stool.  Genitourinary: Negative for dysuria, urgency and frequency.  Neurological: Positive for dizziness and weakness. Negative for sensory change, focal weakness and headaches.  Psychiatric/Behavioral: Negative for depression. The patient is not nervous/anxious.       Objective  Filed Vitals:   12/22/14 1304  BP: 161/74  Pulse: 72  Temp: 98.2 F (36.8 C)  TempSrc: Oral  Resp: 16  Height: 4\' 8"  (1.422 m)  Weight: 104 lb (47.174 kg)     Physical Exam  Constitutional: She is oriented to person, place, and time and well-developed, well-nourished, and in no distress. No distress.  HENT:  Head: Normocephalic and atraumatic.  Eyes: Conjunctivae and EOM are normal. Pupils are equal, round, and reactive to light.  Neck: Normal range of motion. Neck supple. Carotid bruit is not present. No thyromegaly present.  Cardiovascular: Normal rate and intact distal pulses.  Frequent extrasystoles (bigeminy) are present. Exam reveals no gallop and no friction rub.   Murmur heard.  Systolic murmur is present with a grade of 2/6  Heard throughout  Pulmonary/Chest: Effort normal  and breath sounds normal. No respiratory distress. She has no wheezes. She has no rales.  Abdominal: Soft. She exhibits no distension and no mass. There is no tenderness.  Musculoskeletal: She exhibits edema (trace edema bilaterally).  Lymphadenopathy:    She has no cervical adenopathy.  Neurological: She is alert and oriented to person, place, and time. No cranial nerve deficit. Coordination (walks with a cane) abnormal.  Vitals reviewed.     No results found for this or any previous visit (from the past 2160 hour(s)).   Assessment & Plan  1.  Essential hypertension  - Comprehensive Metabolic Panel (CMET)  2. Type 2 diabetes mellitus without complication  - HgB O9G  3. Dyspnea on exertion  - DG Chest 2 View; Future - Ambulatory referral to Cardiology  4. Other specified hypothyroidism  - TSH  5. Chronic fatigue  - CBC with Differential - TSH  6. Gastroesophageal reflux disease without esophagitis   7. Iron deficiency anemia due to chronic blood loss  - CBC with Differential

## 2014-12-22 NOTE — Patient Instructions (Signed)
Continue current meds 

## 2014-12-23 ENCOUNTER — Other Ambulatory Visit: Payer: Self-pay | Admitting: Family Medicine

## 2014-12-23 LAB — CBC WITH DIFFERENTIAL/PLATELET
BASOS ABS: 0 10*3/uL (ref 0.0–0.2)
Basos: 0 %
EOS (ABSOLUTE): 0.1 10*3/uL (ref 0.0–0.4)
Eos: 1 %
HEMOGLOBIN: 14.4 g/dL (ref 11.1–15.9)
Hematocrit: 42 % (ref 34.0–46.6)
Immature Grans (Abs): 0 10*3/uL (ref 0.0–0.1)
Immature Granulocytes: 0 %
LYMPHS ABS: 2 10*3/uL (ref 0.7–3.1)
Lymphs: 28 %
MCH: 30.4 pg (ref 26.6–33.0)
MCHC: 34.3 g/dL (ref 31.5–35.7)
MCV: 89 fL (ref 79–97)
MONOS ABS: 0.5 10*3/uL (ref 0.1–0.9)
Monocytes: 8 %
NEUTROS ABS: 4.4 10*3/uL (ref 1.4–7.0)
Neutrophils: 63 %
PLATELETS: 298 10*3/uL (ref 150–379)
RBC: 4.74 x10E6/uL (ref 3.77–5.28)
RDW: 14.3 % (ref 12.3–15.4)
WBC: 7 10*3/uL (ref 3.4–10.8)

## 2014-12-23 LAB — HEMOGLOBIN A1C
ESTIMATED AVERAGE GLUCOSE: 128 mg/dL
HEMOGLOBIN A1C: 6.1 % — AB (ref 4.8–5.6)

## 2014-12-23 MED ORDER — ALBUTEROL SULFATE HFA 108 (90 BASE) MCG/ACT IN AERS: 2.0000 | INHALATION_SPRAY | Freq: Four times a day (QID) | RESPIRATORY_TRACT | Status: DC | PRN

## 2014-12-24 LAB — COMPREHENSIVE METABOLIC PANEL
ALT: 12 IU/L (ref 0–32)
AST: 18 IU/L (ref 0–40)
Albumin/Globulin Ratio: 2.2 (ref 1.1–2.5)
Albumin: 4.6 g/dL (ref 3.2–4.6)
Alkaline Phosphatase: 96 IU/L (ref 39–117)
BUN/Creatinine Ratio: 19 (ref 11–26)
BUN: 14 mg/dL (ref 10–36)
Bilirubin Total: 0.4 mg/dL (ref 0.0–1.2)
CALCIUM: 10 mg/dL (ref 8.7–10.3)
CO2: 26 mmol/L (ref 18–29)
CREATININE: 0.74 mg/dL (ref 0.57–1.00)
Chloride: 101 mmol/L (ref 97–108)
GFR calc Af Amer: 81 mL/min/{1.73_m2} (ref >59)
GFR, EST NON AFRICAN AMERICAN: 71 mL/min/{1.73_m2} (ref >59)
GLUCOSE: 93 mg/dL (ref 65–99)
Globulin, Total: 2.1 g/dL (ref 1.5–4.5)
Potassium: 3.6 mmol/L (ref 3.5–5.2)
Sodium: 146 mmol/L — ABNORMAL HIGH (ref 134–144)
Total Protein: 6.7 g/dL (ref 6.0–8.5)

## 2014-12-24 LAB — TSH: TSH: 0.848 u[IU]/mL (ref 0.450–4.500)

## 2014-12-31 DIAGNOSIS — E782 Mixed hyperlipidemia: Secondary | ICD-10-CM | POA: Insufficient documentation

## 2014-12-31 DIAGNOSIS — I10 Essential (primary) hypertension: Secondary | ICD-10-CM | POA: Insufficient documentation

## 2015-01-06 ENCOUNTER — Other Ambulatory Visit: Payer: Self-pay

## 2015-01-06 MED ORDER — FOSINOPRIL SODIUM 40 MG PO TABS: 40.0000 mg | ORAL_TABLET | Freq: Every day | ORAL | Status: DC

## 2015-01-15 DIAGNOSIS — R42 Dizziness and giddiness: Secondary | ICD-10-CM | POA: Insufficient documentation

## 2015-01-15 DIAGNOSIS — I071 Rheumatic tricuspid insufficiency: Secondary | ICD-10-CM | POA: Insufficient documentation

## 2015-01-15 DIAGNOSIS — I340 Nonrheumatic mitral (valve) insufficiency: Secondary | ICD-10-CM | POA: Insufficient documentation

## 2015-01-15 DIAGNOSIS — IMO0002 Reserved for concepts with insufficient information to code with codable children: Secondary | ICD-10-CM | POA: Insufficient documentation

## 2015-01-15 DIAGNOSIS — I272 Pulmonary hypertension, unspecified: Secondary | ICD-10-CM | POA: Insufficient documentation

## 2015-01-22 ENCOUNTER — Encounter (INDEPENDENT_AMBULATORY_CARE_PROVIDER_SITE_OTHER): Payer: Self-pay

## 2015-01-22 ENCOUNTER — Encounter: Payer: Self-pay | Admitting: Family Medicine

## 2015-01-22 ENCOUNTER — Ambulatory Visit (INDEPENDENT_AMBULATORY_CARE_PROVIDER_SITE_OTHER): Payer: Medicare Other | Admitting: Family Medicine

## 2015-01-22 VITALS — BP 160/65 | HR 87 | Temp 98.1°F | Resp 16 | Ht <= 58 in | Wt 104.0 lb

## 2015-01-22 DIAGNOSIS — Z23 Encounter for immunization: Secondary | ICD-10-CM

## 2015-01-22 DIAGNOSIS — I10 Essential (primary) hypertension: Secondary | ICD-10-CM | POA: Diagnosis not present

## 2015-01-22 DIAGNOSIS — R7309 Other abnormal glucose: Secondary | ICD-10-CM

## 2015-01-22 DIAGNOSIS — R42 Dizziness and giddiness: Secondary | ICD-10-CM | POA: Diagnosis not present

## 2015-01-22 DIAGNOSIS — R7303 Prediabetes: Secondary | ICD-10-CM

## 2015-01-22 DIAGNOSIS — R002 Palpitations: Secondary | ICD-10-CM | POA: Insufficient documentation

## 2015-01-22 MED ORDER — CARVEDILOL 3.125 MG PO TABS: 3.1250 mg | ORAL_TABLET | Freq: Two times a day (BID) | ORAL | Status: DC

## 2015-01-22 NOTE — Progress Notes (Signed)
Name: Krista Randall   MRN: [id number]    DOB: 1921-03-10   Date:01/22/2015       Progress Note  Subjective  Chief Complaint  Chief Complaint  Patient presents with  . Hypertension    month follow up    HPI Here for f/u of HBP.  Still feels "dizzy" a lot.  Has had Holter monitor and Echo from Dr. Nehemiah Massed, but results pending.  Otherwise she is feeling ok.  Had labs done last month.  Is pre-dioabetic.    No problem-specific assessment & plan notes found for this encounter.   Past Medical History  Diagnosis Date  . Breast cancer 2009    left breast  . Breast mass march 2016    rt lump @ 9 0C NKI     Social History  Substance Use Topics  . Smoking status: Not on file  . Smokeless tobacco: Not on file  . Alcohol Use: Not on file     Current outpatient prescriptions:  .  albuterol (PROVENTIL HFA;VENTOLIN HFA) 108 (90 BASE) MCG/ACT inhaler, Inhale 2 puffs into the lungs every 6 (six) hours as needed for wheezing or shortness of breath (inhale 2 puffs before activities.)., Disp: 1 Inhaler, Rfl: 2 .  amLODipine (NORVASC) 10 MG tablet, Take 10 mg by mouth daily., Disp: , Rfl: 11 .  fluticasone (FLONASE) 50 MCG/ACT nasal spray, Place 1 spray into both nostrils daily as needed., Disp: , Rfl: 11 .  fosinopril (MONOPRIL) 40 MG tablet, Take 1 tablet (40 mg total) by mouth daily., Disp: 30 tablet, Rfl: 3 .  hydrochlorothiazide (MICROZIDE) 12.5 MG capsule, Take 12.5 mg by mouth daily., Disp: , Rfl: 5 .  latanoprost (XALATAN) 0.005 % ophthalmic solution, Place 1 drop into both eyes daily., Disp: , Rfl: 4 .  levothyroxine (SYNTHROID, LEVOTHROID) 75 MCG tablet, Take 75 mcg by mouth daily., Disp: , Rfl: 5 .  pantoprazole (PROTONIX) 40 MG tablet, Take 1 tablet (40 mg total) by mouth 2 (two) times daily., Disp: 60 tablet, Rfl: 11 .  potassium chloride (K-DUR) 10 MEQ tablet, Take 10 mEq by mouth daily., Disp: , Rfl: 5  No Known Allergies  Review of Systems  Constitutional: Negative for  fever, chills, weight loss and malaise/fatigue.  HENT: Negative for hearing loss.   Eyes: Negative for blurred vision and double vision.  Respiratory: Positive for shortness of breath (chronic). Negative for cough, sputum production and wheezing.   Cardiovascular: Negative for chest pain, palpitations, orthopnea and leg swelling.  Gastrointestinal: Negative for heartburn, abdominal pain and blood in stool.  Genitourinary: Negative for dysuria, urgency and frequency.  Skin: Negative for rash.  Neurological: Positive for dizziness (chronic). Negative for weakness and headaches.      Objective  Filed Vitals:   01/22/15 1046  BP: 172/62  Pulse: 87  Temp: 98.1 F (36.7 C)  TempSrc: Oral  Resp: 16  Height: 4\' 8"  (1.422 m)  Weight: 104 lb (47.174 kg)     Physical Exam  Constitutional: She is oriented to person, place, and time and well-developed, well-nourished, and in no distress. No distress.  HENT:  Head: Normocephalic and atraumatic.  Eyes: Conjunctivae and EOM are normal. Pupils are equal, round, and reactive to light. No scleral icterus.  Neck: Normal range of motion. Neck supple. Carotid bruit is not present. No thyromegaly present.  Cardiovascular: Normal rate, regular rhythm, normal heart sounds and intact distal pulses.  Exam reveals no gallop and no friction rub.   No murmur heard.  Pulmonary/Chest: No respiratory distress. She has no wheezes. She has no rales.  Abdominal: Soft. Bowel sounds are normal. She exhibits abdominal bruit. She exhibits no distension and no mass. There is no tenderness.  Musculoskeletal: She exhibits edema (trace bilateral pedal edema).  Lymphadenopathy:    She has no cervical adenopathy.  Neurological: She is alert and oriented to person, place, and time.  Vitals reviewed.     Recent Results (from the past 2160 hour(s))  CBC with Differential     Status: None   Collection Time: 12/23/14 10:24 AM  Result Value Ref Range   WBC 7.0 3.4 -  10.8 x10E3/uL   RBC 4.74 3.77 - 5.28 x10E6/uL   Hemoglobin 14.4 11.1 - 15.9 g/dL   Hematocrit 42.0 34.0 - 46.6 %   MCV 89 79 - 97 fL   MCH 30.4 26.6 - 33.0 pg   MCHC 34.3 31.5 - 35.7 g/dL   RDW 14.3 12.3 - 15.4 %   Platelets 298 150 - 379 x10E3/uL   Neutrophils 63 %   Lymphs 28 %   Monocytes 8 %   Eos 1 %   Basos 0 %   Neutrophils Absolute 4.4 1.4 - 7.0 x10E3/uL   Lymphocytes Absolute 2.0 0.7 - 3.1 x10E3/uL   Monocytes Absolute 0.5 0.1 - 0.9 x10E3/uL   EOS (ABSOLUTE) 0.1 0.0 - 0.4 x10E3/uL   Basophils Absolute 0.0 0.0 - 0.2 x10E3/uL   Immature Granulocytes 0 %   Immature Grans (Abs) 0.0 0.0 - 0.1 x10E3/uL  Comprehensive Metabolic Panel (CMET)     Status: Abnormal   Collection Time: 12/23/14 10:24 AM  Result Value Ref Range   Glucose 93 65 - 99 mg/dL   BUN 14 10 - 36 mg/dL   Creatinine, Ser 0.74 0.57 - 1.00 mg/dL   GFR calc non Af Amer 71 >59 mL/min/1.73   GFR calc Af Amer 81 >59 mL/min/1.73   BUN/Creatinine Ratio 19 11 - 26   Sodium 146 (H) 134 - 144 mmol/L   Potassium 3.6 3.5 - 5.2 mmol/L   Chloride 101 97 - 108 mmol/L   CO2 26 18 - 29 mmol/L   Calcium 10.0 8.7 - 10.3 mg/dL   Total Protein 6.7 6.0 - 8.5 g/dL   Albumin 4.6 3.2 - 4.6 g/dL   Globulin, Total 2.1 1.5 - 4.5 g/dL   Albumin/Globulin Ratio 2.2 1.1 - 2.5   Bilirubin Total 0.4 0.0 - 1.2 mg/dL   Alkaline Phosphatase 96 39 - 117 IU/L   AST 18 0 - 40 IU/L   ALT 12 0 - 32 IU/L  TSH     Status: None   Collection Time: 12/23/14 10:24 AM  Result Value Ref Range   TSH 0.848 0.450 - 4.500 uIU/mL  HgB A1c     Status: Abnormal   Collection Time: 12/23/14 10:24 AM  Result Value Ref Range   Hgb A1c MFr Bld 6.1 (H) 4.8 - 5.6 %    Comment:          Pre-diabetes: 5.7 - 6.4          Diabetes: >6.4          Glycemic control for adults with diabetes: <7.0    Est. average glucose Bld gHb Est-mCnc 128 mg/dL     Assessment & Plan  1. Benign essential HTN  - carvedilol (COREG) 3.125 MG tablet; Take 1 tablet (3.125 mg  total) by mouth 2 (two) times daily with a meal.  Dispense: 60 tablet; Refill:  12  -Continue other meds. 2. Pre-diabetes -continue to watch sugar intake.  3. Dizziness   4. Need for influenza vaccination  - Flu vaccine HIGH DOSE PF (Fluzone High dose)

## 2015-02-01 ENCOUNTER — Other Ambulatory Visit: Payer: Self-pay | Admitting: Family Medicine

## 2015-02-01 MED ORDER — POTASSIUM CHLORIDE ER 10 MEQ PO TBCR: 10.0000 meq | EXTENDED_RELEASE_TABLET | Freq: Every day | ORAL | Status: DC

## 2015-02-18 ENCOUNTER — Telehealth: Payer: Self-pay | Admitting: *Deleted

## 2015-02-18 NOTE — Telephone Encounter (Signed)
Patient says she has been taking (2) Potassium daily. According to [employer] this is correct, Epic states 1 per day. Patient filled for 30 tab directions 1 per day and she has been taking (2), therefore out of meds in 2 days.  Please advise.

## 2015-02-19 NOTE — Telephone Encounter (Signed)
She should be taking 2, 10 eq potassium tabs a day.  Please change order to pharmacy.  Thanks.-jh

## 2015-02-22 NOTE — Telephone Encounter (Signed)
Called [facility] and corrected rx.Espy

## 2015-03-05 ENCOUNTER — Ambulatory Visit: Payer: Medicare Other | Admitting: Oncology

## 2015-03-05 ENCOUNTER — Other Ambulatory Visit: Payer: Medicare Other

## 2015-03-12 ENCOUNTER — Inpatient Hospital Stay (HOSPITAL_BASED_OUTPATIENT_CLINIC_OR_DEPARTMENT_OTHER): Payer: Medicare Other | Admitting: Oncology

## 2015-03-12 ENCOUNTER — Inpatient Hospital Stay: Payer: Medicare Other | Attending: Oncology

## 2015-03-12 VITALS — BP 187/80 | HR 89 | Temp 97.1°F | Resp 16 | Wt 104.5 lb

## 2015-03-12 DIAGNOSIS — Z170 Estrogen receptor positive status [ER+]: Secondary | ICD-10-CM

## 2015-03-12 DIAGNOSIS — Z9223 Personal history of estrogen therapy: Secondary | ICD-10-CM | POA: Insufficient documentation

## 2015-03-12 DIAGNOSIS — Z853 Personal history of malignant neoplasm of breast: Secondary | ICD-10-CM | POA: Insufficient documentation

## 2015-03-12 DIAGNOSIS — Z801 Family history of malignant neoplasm of trachea, bronchus and lung: Secondary | ICD-10-CM

## 2015-03-12 DIAGNOSIS — N63 Unspecified lump in unspecified breast: Secondary | ICD-10-CM

## 2015-03-12 DIAGNOSIS — Z800 Family history of malignant neoplasm of digestive organs: Secondary | ICD-10-CM | POA: Diagnosis not present

## 2015-03-12 DIAGNOSIS — Z79899 Other long term (current) drug therapy: Secondary | ICD-10-CM | POA: Insufficient documentation

## 2015-03-12 DIAGNOSIS — I10 Essential (primary) hypertension: Secondary | ICD-10-CM

## 2015-03-12 DIAGNOSIS — Z9012 Acquired absence of left breast and nipple: Secondary | ICD-10-CM | POA: Diagnosis not present

## 2015-03-12 DIAGNOSIS — C50912 Malignant neoplasm of unspecified site of left female breast: Secondary | ICD-10-CM

## 2015-03-12 LAB — CBC WITH DIFFERENTIAL/PLATELET
BASOS PCT: 1 %
Basophils Absolute: 0 10*3/uL (ref 0–0.1)
EOS ABS: 0 10*3/uL (ref 0–0.7)
Eosinophils Relative: 1 %
HEMATOCRIT: 42.2 % (ref 35.0–47.0)
HEMOGLOBIN: 14.2 g/dL (ref 12.0–16.0)
Lymphocytes Relative: 23 %
Lymphs Abs: 1.5 10*3/uL (ref 1.0–3.6)
MCH: 30.3 pg (ref 26.0–34.0)
MCHC: 33.5 g/dL (ref 32.0–36.0)
MCV: 90.4 fL (ref 80.0–100.0)
Monocytes Absolute: 0.3 10*3/uL (ref 0.2–0.9)
Monocytes Relative: 5 %
NEUTROS ABS: 4.6 10*3/uL (ref 1.4–6.5)
NEUTROS PCT: 70 %
Platelets: 239 10*3/uL (ref 150–440)
RBC: 4.67 MIL/uL (ref 3.80–5.20)
RDW: 14.3 % (ref 11.5–14.5)
WBC: 6.5 10*3/uL (ref 3.6–11.0)

## 2015-03-12 LAB — COMPREHENSIVE METABOLIC PANEL
ALBUMIN: 4.3 g/dL (ref 3.5–5.0)
ALK PHOS: 100 U/L (ref 38–126)
ALT: 18 U/L (ref 14–54)
ANION GAP: 11 (ref 5–15)
AST: 27 U/L (ref 15–41)
BILIRUBIN TOTAL: 0.3 mg/dL (ref 0.3–1.2)
BUN: 15 mg/dL (ref 6–20)
CALCIUM: 9.4 mg/dL (ref 8.9–10.3)
CO2: 31 mmol/L (ref 22–32)
CREATININE: 0.9 mg/dL (ref 0.44–1.00)
Chloride: 101 mmol/L (ref 101–111)
GFR calc Af Amer: >60 mL/min (ref >60)
GFR calc non Af Amer: 54 mL/min — ABNORMAL LOW (ref >60)
GLUCOSE: 163 mg/dL — AB (ref 65–99)
Potassium: 3 mmol/L — ABNORMAL LOW (ref 3.5–5.1)
SODIUM: 143 mmol/L (ref 135–145)
Total Protein: 7.2 g/dL (ref 6.5–8.1)

## 2015-03-12 NOTE — Progress Notes (Signed)
Patient had her mammogram in 08/2014 and also had to get an ultrasound that results were negative.

## 2015-03-29 NOTE — Progress Notes (Signed)
[place]  [phone] [phone]  ID: Krista Randall OB: 1921/04/21  MR#: [id number]  [id number]  Patient Care Team: Arlis Porta., MD as PCP - General (Family Medicine)  CHIEF COMPLAINT:  Chief Complaint  Patient presents with  . Breast Cancer    INTERVAL HISTORY: Patient returns to clinic today for routine yearly evaluation of her breast cancer. She continues to feel well and remains asymptomatic. She has no neurologic complaints. He denies any recent fevers or illnesses. She has good appetite and denies weight loss. She denies any pain. She has no chest pain or shortness of breath. She denies any nausea, vomiting, constipation, or diarrhea. She has no urinary complaints. Patient feels at her baseline and offers no specific complaints today.  REVIEW OF SYSTEMS:   Review of Systems  Constitutional: Negative.   Respiratory: Negative.   Cardiovascular: Negative.   Gastrointestinal: Negative.   Musculoskeletal: Negative.   Neurological: Negative.     As per HPI. Otherwise, a complete review of systems is negatve.  PAST MEDICAL HISTORY: Past Medical History  Diagnosis Date  . Breast cancer 2009    left breast  . Breast mass march 2016    rt lump @ 9 0C [facility]     PAST SURGICAL HISTORY: Past Surgical History  Procedure Laterality Date  . Mastectomy Left 2009  . Breast biopsy Right 1980's    negative    FAMILY HISTORY Family History  Problem Relation Age of Onset  . Colon cancer Mother 52  . Cancer Brother 2    lung       ADVANCED DIRECTIVES:    HEALTH MAINTENANCE: Social History  Substance Use Topics  . Smoking status: Not on file  . Smokeless tobacco: Not on file  . Alcohol Use: Not on file     Colonoscopy:  PAP:  Bone density:  Lipid panel:  No Known Allergies  Current Outpatient Prescriptions  Medication Sig Dispense Refill  . albuterol (PROVENTIL HFA;VENTOLIN HFA) 108 (90 BASE) MCG/ACT  inhaler Inhale 2 puffs into the lungs every 6 (six) hours as needed for wheezing or shortness of breath (inhale 2 puffs before activities.). 1 Inhaler 2  . carvedilol (COREG) 3.125 MG tablet Take 1 tablet (3.125 mg total) by mouth 2 (two) times daily with a meal. 60 tablet 12  . fluticasone (FLONASE) 50 MCG/ACT nasal spray Place 1 spray into both nostrils daily as needed.  11  . fosinopril (MONOPRIL) 40 MG tablet Take 1 tablet (40 mg total) by mouth daily. 30 tablet 3  . hydrochlorothiazide (MICROZIDE) 12.5 MG capsule Take 12.5 mg by mouth daily.  5  . latanoprost (XALATAN) 0.005 % ophthalmic solution Place 1 drop into both eyes daily.  4  . levothyroxine (SYNTHROID, LEVOTHROID) 75 MCG tablet Take 75 mcg by mouth daily.  5  . pantoprazole (PROTONIX) 40 MG tablet Take 1 tablet (40 mg total) by mouth 2 (two) times daily. 60 tablet 11  . potassium chloride (K-DUR) 10 MEQ tablet Take 1 tablet (10 mEq total) by mouth daily. 30 tablet 5   No current facility-administered medications for this visit.    OBJECTIVE: Filed Vitals:   03/12/15 1124  BP: 187/80  Pulse: 89  Temp: 97.1 F (36.2 C)  Resp: 16     Body mass index is 23.44 kg/(m^2).    ECOG FS:0 - Asymptomatic  General: Well-developed, well-nourished, no acute distress. Eyes: Pink conjunctiva, anicteric sclera. Breasts: Patient refused exam today. Lungs: Clear to  auscultation bilaterally. Heart: Regular rate and rhythm. No rubs, murmurs, or gallops. Abdomen: Soft, nontender, nondistended. No organomegaly noted, normoactive bowel sounds. Musculoskeletal: No edema, cyanosis, or clubbing. Neuro: Alert, answering all questions appropriately. Cranial nerves grossly intact. Skin: No rashes or petechiae noted. Psych: Normal affect.   LAB RESULTS:  Lab Results  Component Value Date   NA 143 03/12/2015   K 3.0* 03/12/2015   CL 101 03/12/2015   CO2 31 03/12/2015   GLUCOSE 163* 03/12/2015   BUN 15 03/12/2015   CREATININE 0.90  03/12/2015   CALCIUM 9.4 03/12/2015   PROT 7.2 03/12/2015   ALBUMIN 4.3 03/12/2015   AST 27 03/12/2015   ALT 18 03/12/2015   ALKPHOS 100 03/12/2015   BILITOT 0.3 03/12/2015   GFRNONAA 54* 03/12/2015   GFRAA >60 03/12/2015    Lab Results  Component Value Date   WBC 6.5 03/12/2015   NEUTROABS 4.6 03/12/2015   HGB 14.2 03/12/2015   HCT 42.2 03/12/2015   MCV 90.4 03/12/2015   PLT 239 03/12/2015     STUDIES: No results found.  ASSESSMENT: Stage IIa ER/PR positive lobular carcinoma diagnosed in 2009.  PLAN:    1. Breast cancer: No evidence of disease.  Patient completed 5 years of letrozole in approximately 2014. Patient's most recent mammogram on Sep 03, 2014 was reported as BI-RADS 1, repeat in May 2017. Return to clinic in 1 year for further evaluation. Once patient is 10 years removed from diagnosis in 2019, she can be discharged from clinic. 2. Hypertension: Patient's blood pressure is significantly elevated today. Continue treatment per PCP.  Patient expressed understanding and was in agreement with this plan. She also understands that She can call clinic at any time with any questions, concerns, or complaints.    Lloyd Huger, MD   03/29/2015 11:32 PM

## 2015-04-19 ENCOUNTER — Other Ambulatory Visit: Payer: Self-pay | Admitting: Family Medicine

## 2015-05-03 ENCOUNTER — Other Ambulatory Visit: Payer: Self-pay | Admitting: Family Medicine

## 2015-05-19 ENCOUNTER — Emergency Department: Payer: Medicare Other

## 2015-05-19 ENCOUNTER — Encounter: Payer: Self-pay | Admitting: *Deleted

## 2015-05-19 ENCOUNTER — Inpatient Hospital Stay
Admission: EM | Admit: 2015-05-19 | Discharge: 2015-05-21 | DRG: 379 | Disposition: A | Discharge status: Home / Self Care | Payer: Medicare Other | Attending: Internal Medicine | Admitting: Internal Medicine

## 2015-05-19 DIAGNOSIS — Z853 Personal history of malignant neoplasm of breast: Secondary | ICD-10-CM

## 2015-05-19 DIAGNOSIS — E876 Hypokalemia: Secondary | ICD-10-CM | POA: Diagnosis present

## 2015-05-19 DIAGNOSIS — K922 Gastrointestinal hemorrhage, unspecified: Secondary | ICD-10-CM | POA: Diagnosis present

## 2015-05-19 DIAGNOSIS — Z7951 Long term (current) use of inhaled steroids: Secondary | ICD-10-CM | POA: Diagnosis not present

## 2015-05-19 DIAGNOSIS — Z79899 Other long term (current) drug therapy: Secondary | ICD-10-CM | POA: Diagnosis not present

## 2015-05-19 DIAGNOSIS — K449 Diaphragmatic hernia without obstruction or gangrene: Secondary | ICD-10-CM | POA: Diagnosis present

## 2015-05-19 DIAGNOSIS — I082 Rheumatic disorders of both aortic and tricuspid valves: Secondary | ICD-10-CM | POA: Diagnosis present

## 2015-05-19 DIAGNOSIS — K254 Chronic or unspecified gastric ulcer with hemorrhage: Secondary | ICD-10-CM | POA: Diagnosis present

## 2015-05-19 DIAGNOSIS — I10 Essential (primary) hypertension: Secondary | ICD-10-CM | POA: Diagnosis present

## 2015-05-19 HISTORY — DX: Diaphragmatic hernia without obstruction or gangrene: K44.9

## 2015-05-19 LAB — CBC WITH DIFFERENTIAL/PLATELET
BASOS PCT: 0 %
Basophils Absolute: 0 10*3/uL (ref 0–0.1)
Eosinophils Absolute: 0 10*3/uL (ref 0–0.7)
Eosinophils Relative: 0 %
HEMATOCRIT: 44.2 % (ref 35.0–47.0)
Hemoglobin: 14.4 g/dL (ref 12.0–16.0)
LYMPHS PCT: 6 %
Lymphs Abs: 0.8 10*3/uL — ABNORMAL LOW (ref 1.0–3.6)
MCH: 28.8 pg (ref 26.0–34.0)
MCHC: 32.6 g/dL (ref 32.0–36.0)
MCV: 88.5 fL (ref 80.0–100.0)
MONO ABS: 0.6 10*3/uL (ref 0.2–0.9)
Monocytes Relative: 5 %
NEUTROS ABS: 11.1 10*3/uL — AB (ref 1.4–6.5)
Neutrophils Relative %: 89 %
PLATELETS: 304 10*3/uL (ref 150–440)
RBC: 4.99 MIL/uL (ref 3.80–5.20)
RDW: 14.1 % (ref 11.5–14.5)
WBC: 12.5 10*3/uL — AB (ref 3.6–11.0)

## 2015-05-19 LAB — PROTIME-INR
INR: 1.01
PROTHROMBIN TIME: 13.5 s (ref 11.4–15.0)

## 2015-05-19 LAB — TYPE AND SCREEN
ABO/RH(D): O POS
ANTIBODY SCREEN: NEGATIVE

## 2015-05-19 LAB — COMPREHENSIVE METABOLIC PANEL
ALT: 19 U/L (ref 14–54)
ANION GAP: 12 (ref 5–15)
AST: 27 U/L (ref 15–41)
Albumin: 5 g/dL (ref 3.5–5.0)
Alkaline Phosphatase: 97 U/L (ref 38–126)
BILIRUBIN TOTAL: 0.8 mg/dL (ref 0.3–1.2)
BUN: 15 mg/dL (ref 6–20)
CHLORIDE: 96 mmol/L — AB (ref 101–111)
CO2: 36 mmol/L — ABNORMAL HIGH (ref 22–32)
Calcium: 10 mg/dL (ref 8.9–10.3)
Creatinine, Ser: 0.83 mg/dL (ref 0.44–1.00)
GFR, EST NON AFRICAN AMERICAN: 59 mL/min — AB (ref >60)
Glucose, Bld: 190 mg/dL — ABNORMAL HIGH (ref 65–99)
POTASSIUM: 3.2 mmol/L — AB (ref 3.5–5.1)
Sodium: 144 mmol/L (ref 135–145)
TOTAL PROTEIN: 7.8 g/dL (ref 6.5–8.1)

## 2015-05-19 LAB — LIPASE, BLOOD: LIPASE: 61 U/L — AB (ref 11–51)

## 2015-05-19 LAB — ABO/RH: ABO/RH(D): O POS

## 2015-05-19 MED ORDER — SODIUM CHLORIDE 0.9 % IV BOLUS (SEPSIS)
1000.0000 mL | Freq: Once | INTRAVENOUS | Status: AC
  Administered 2015-05-19: 1000 mL via INTRAVENOUS

## 2015-05-19 MED ORDER — METOCLOPRAMIDE HCL 5 MG/ML IJ SOLN
10.0000 mg | Freq: Once | INTRAMUSCULAR | Status: AC
  Administered 2015-05-19: 10 mg via INTRAVENOUS
  Filled 2015-05-19: qty 2

## 2015-05-19 MED ORDER — PANTOPRAZOLE SODIUM 40 MG IV SOLR
40.0000 mg | Freq: Once | INTRAVENOUS | Status: AC
  Administered 2015-05-19: 40 mg via INTRAVENOUS
  Filled 2015-05-19: qty 40

## 2015-05-19 MED ORDER — ONDANSETRON HCL 4 MG/2ML IJ SOLN
4.0000 mg | Freq: Four times a day (QID) | INTRAMUSCULAR | Status: DC | PRN
  Administered 2015-05-20: 4 mg via INTRAVENOUS
  Filled 2015-05-19: qty 2

## 2015-05-19 MED ORDER — PANTOPRAZOLE SODIUM 40 MG IV SOLR
40.0000 mg | Freq: Two times a day (BID) | INTRAVENOUS | Status: DC
  Administered 2015-05-20 – 2015-05-21 (×4): 40 mg via INTRAVENOUS
  Filled 2015-05-19 (×4): qty 40

## 2015-05-19 MED ORDER — POTASSIUM CHLORIDE 10 MEQ/100ML IV SOLN
10.0000 meq | INTRAVENOUS | Status: AC
  Administered 2015-05-20 (×2): 10 meq via INTRAVENOUS
  Filled 2015-05-19 (×2): qty 100

## 2015-05-19 NOTE — H&P (Signed)
[facility] [place] NAME: Krista Randall    MR#:  [id number]  DATE OF BIRTH:  Oct 03, 1920  DATE OF ADMISSION:  05/19/2015  PRIMARY CARE PHYSICIAN: Dicky Doe, MD   REQUESTING/REFERRING PHYSICIAN:   CHIEF COMPLAINT:   Chief Complaint  Patient presents with  . GI Bleeding    HISTORY OF PRESENT ILLNESS: Krista Randall  is a 82 y.o. female with a known history of breast cancer, hiatal hernia presented to the emergency room where she has been vomiting since 2 AM this morning. Patient vomited more than 20 times since this morning. Patient lives alone and her daughter was at bedside. According to the patient's family patient vomited blood and hence she was brought to the hospital. No history of any travel or eating food outside. Has abdominal pain which is aching in nature 3 out of 10. Pain is located in the epigastric area. No history of any radiation of the pain. Patient says she had an upper endoscopy 2 years ago and says they detected a hernia at that time. Evaluation in the emergency room was done and hemoglobin was around 14. Antiemetic medication was given. No history of any blood per rectum or any diarrhea. No history of any fever or chills. No complaints of any chest pain or shortness of breath.  PAST MEDICAL HISTORY:   Past Medical History  Diagnosis Date  . Breast cancer ([place]) 2009    left breast  . Breast mass march 2016    rt lump @ [place]   . Hernia, hiatal     PAST SURGICAL HISTORY: Past Surgical History  Procedure Laterality Date  . Mastectomy Left 2009  . Breast biopsy Right 1980's    negative    SOCIAL HISTORY:  Social History  Substance Use Topics  . Smoking status: Never Smoker   . Smokeless tobacco: Not on file  . Alcohol Use: No    FAMILY HISTORY:  Family History  Problem Relation Age of Onset  . Colon cancer Mother 53  . Cancer Brother 80    lung  . Diabetes Mellitus I Daughter     DRUG ALLERGIES:  No Known Allergies  REVIEW OF SYSTEMS:   CONSTITUTIONAL: No fever, has weakness.  EYES: No blurred or double vision.  EARS, NOSE, AND THROAT: No tinnitus or ear pain.  RESPIRATORY: No cough, shortness of breath, wheezing or hemoptysis.  CARDIOVASCULAR: No chest pain, orthopnea, edema.  GASTROINTESTINAL: Has nausea, vomiting, abdominal pain. No diarrhoea. Has blood in the vomitus. GENITOURINARY: No dysuria, hematuria.  ENDOCRINE: No polyuria, nocturia,  HEMATOLOGY: No anemia, vomiting of blood noted. SKIN: No rash or lesion. MUSCULOSKELETAL: No joint pain or arthritis.   NEUROLOGIC: No tingling, numbness, weakness.  PSYCHIATRY: No anxiety or depression.   MEDICATIONS AT HOME:  Prior to Admission medications   Medication Sig Start Date End Date Taking? Authorizing Provider  acidophilus (RISAQUAD) CAPS capsule Take 1 capsule by mouth daily.   Yes Historical Provider, MD  carvedilol (COREG) 3.125 MG tablet Take 1 tablet (3.125 mg total) by mouth 2 (two) times daily with a meal. 01/22/15  Yes Arlis Porta., MD  ferrous sulfate 325 (65 FE) MG tablet Take 325 mg by mouth daily with breakfast.   Yes Historical Provider, MD  fluticasone (FLONASE) 50 MCG/ACT nasal spray Place 1 spray into both nostrils daily as needed for rhinitis.    Yes Historical Provider, MD  fosinopril (MONOPRIL) 40 MG tablet  Take 40 mg by mouth daily.   Yes Historical Provider, MD  hydrochlorothiazide (MICROZIDE) 12.5 MG capsule Take 12.5 mg by mouth daily.   Yes Historical Provider, MD  latanoprost (XALATAN) 0.005 % ophthalmic solution Place 1 drop into both eyes at bedtime.    Yes Historical Provider, MD  levothyroxine (SYNTHROID, LEVOTHROID) 50 MCG tablet Take 50 mcg by mouth daily before breakfast.   Yes Historical Provider, MD  pantoprazole (PROTONIX) 40 MG tablet Take 40 mg by mouth daily.   Yes Historical Provider, MD  potassium chloride (K-DUR) 10 MEQ tablet Take 1 tablet (10 mEq total) by mouth daily. 02/01/15   Yes Arlis Porta., MD  albuterol (PROVENTIL HFA;VENTOLIN HFA) 108 (90 BASE) MCG/ACT inhaler Inhale 2 puffs into the lungs every 6 (six) hours as needed for wheezing or shortness of breath (inhale 2 puffs before activities.). Patient not taking: Reported on 05/19/2015 12/23/14   Arlis Porta., MD      PHYSICAL EXAMINATION:   VITAL SIGNS: Blood pressure 156/69, pulse 92, temperature 98.6 F (37 C), temperature source Oral, resp. rate 22, height 4\' 8"  (1.422 m), weight 45.36 kg (100 lb), SpO2 96 %.  GENERAL:  82 y.o.-year-old patient lying in the bed with no acute distress.  EYES: Pupils equal, round, reactive to light and accommodation. No scleral icterus. Extraocular muscles intact.  HEENT: Head atraumatic, normocephalic. Oropharynx dry and nasopharynx clear.  NECK:  Supple, no jugular venous distention. No thyroid enlargement, no tenderness.  LUNGS: Normal breath sounds bilaterally, no wheezing, rales,rhonchi or crepitation. No use of accessory muscles of respiration.  CARDIOVASCULAR: S1, S2 normal. No murmurs, rubs, or gallops.  ABDOMEN: Soft, mild tenderness in the epigastrium, nondistended. Bowel sounds present. No organomegaly or mass.  EXTREMITIES: No pedal edema, cyanosis, or clubbing.  NEUROLOGIC: Cranial nerves II through XII are intact. Muscle strength 5/5 in all extremities. Sensation intact. Gait not checked.  PSYCHIATRIC: The patient is alert and oriented x 3.  SKIN: No obvious rash, lesion, or ulcer.   LABORATORY PANEL:   CBC  Recent Labs Lab 05/19/15 2040  WBC 12.5*  HGB 14.4  HCT 44.2  PLT 304  MCV 88.5  MCH 28.8  MCHC 32.6  RDW 14.1  LYMPHSABS 0.8*  MONOABS 0.6  EOSABS 0.0  BASOSABS 0.0   ------------------------------------------------------------------------------------------------------------------  Chemistries   Recent Labs Lab 05/19/15 2040  NA 144  K 3.2*  CL 96*  CO2 36*  GLUCOSE 190*  BUN 15  CREATININE 0.83  CALCIUM 10.0   AST 27  ALT 19  ALKPHOS 97  BILITOT 0.8   ------------------------------------------------------------------------------------------------------------------ estimated creatinine clearance is 27.2 mL/min (by C-G formula based on Cr of 0.83). ------------------------------------------------------------------------------------------------------------------ No results for input(s): TSH, T4TOTAL, T3FREE, THYROIDAB in the last 72 hours.  Invalid input(s): FREET3   Coagulation profile  Recent Labs Lab 05/19/15 2040  INR 1.01   ------------------------------------------------------------------------------------------------------------------- No results for input(s): DDIMER in the last 72 hours. -------------------------------------------------------------------------------------------------------------------  Cardiac Enzymes No results for input(s): CKMB, TROPONINI, MYOGLOBIN in the last 168 hours.  Invalid input(s): CK ------------------------------------------------------------------------------------------------------------------ Invalid input(s): POCBNP  ---------------------------------------------------------------------------------------------------------------  Urinalysis No results found for: COLORURINE, APPEARANCEUR, LABSPEC, PHURINE, GLUCOSEU, HGBUR, BILIRUBINUR, KETONESUR, PROTEINUR, UROBILINOGEN, NITRITE, LEUKOCYTESUR   RADIOLOGY: Dg Chest Portable 1 View  05/19/2015  CLINICAL DATA:  Abdominal pain with coffee ground emesis. History of GI bleed. EXAM: PORTABLE CHEST 1 VIEW COMPARISON:  12/22/2014 and 04/01/2011. FINDINGS: 2041 hours. The heart is enlarged. There is a large hiatal hernia which has mildly enlarged.  There is increased left lower lobe opacity, probably passive atelectasis. The right lung appears clear. There is no pleural effusion or pneumothorax. The bones appear unchanged. IMPRESSION: Enlarging, large hiatal hernia with increasing probable left lower lobe  atelectasis. Electronically Signed   By: Richardean Sale M.D.   On: 05/19/2015 21:09    EKG: Orders placed or performed during the hospital encounter of 05/19/15  . ED EKG  . ED EKG  . EKG 12-Lead  . EKG 12-Lead    IMPRESSION AND PLAN: 82 year old female patient with history of breast cancer, hiatal hernia presented to the emergency room with multiple episodes of vomiting, nausea. Patient patient had hematemesis and presented to the emergency room. Admitting diagnosis 1. Acute GI bleed 2. Hematemesis 3. Intractable nausea vomiting 4. Hypokalemia 5. Abdominal pain Treatment plan Admit patient to telemetry under inpatient service  IV hydration with D5 normal saline IV Protonix 40 mg every 12 hourly Keep patient nothing by mouth Gastroenterology consultation and replace potassium intravenously Avoid blood thinner medication Serial hemoglobin and hematocrit monitoring PRBC transfusion if needed. .  All the records are reviewed and case discussed with ED provider. Management plans discussed with the patient, family and they are in agreement.  CODE STATUS:FULL CODE Code Status History    This patient does not have a recorded code status. Please follow your organizational policy for patients in this situation.       TOTAL TIME TAKING CARE OF THIS PATIENT: 50 minutes.    Saundra Shelling M.D on 05/19/2015 at 11:51 PM  Between 7am to 6pm - Pager - [phone]  After 6pm go to [email or web address] - password [email or web address] [facility]  Office  [phone]  CC: Primary care physician; Dicky Doe, MD

## 2015-05-19 NOTE — ED Notes (Signed)
Notified MD of BP 222/106

## 2015-05-19 NOTE — ED Notes (Signed)
Pt to triage via wheelchair with vomiting coffee grounds and abd pain.  Pt reports feeling dizzy.  Pt has hx gi bleed 2 years ago.  Pt alert.  Speech clear.

## 2015-05-19 NOTE — ED Provider Notes (Signed)
[facility] Emergency Department Provider Note  ____________________________________________  Time seen: 8:10 PM  I have reviewed the triage vital signs and the nursing notes.   HISTORY  Chief Complaint GI Bleeding    HPI Krista Randall is a 82 y.o. female who complains of hematemesis that started at 2:00 AM today. She estimates that she has had 30 vomiting episodes since then. She is feeling diffusely weak and dizzy. Denies chest pain or shortness of breath. Has upper abdominal pain and nausea as well. Has a history of upper GI bleed 2 years ago. Denies taking any kind of acid suppression medicines. Larene Beach is her primary care doctor.  Upper abdominal pain is nonradiating, no aggravating or alleviating factors. He feels like a dull aching. Moderate intensity   Past Medical History  Diagnosis Date  . Breast cancer ([place]) 2009    left breast  . Breast mass march 2016    rt lump @ 9 0C [facility]      Patient Active Problem List   Diagnosis Date Noted  . Awareness of heartbeats 01/22/2015  . Pre-diabetes 01/22/2015  . Need for influenza vaccination 01/22/2015  . Hypertensive pulmonary vascular disease ([place]) 01/15/2015  . Dizziness 01/15/2015  . MI (mitral incompetence) 01/15/2015  . TI (tricuspid incompetence) 01/15/2015  . Benign essential HTN 12/31/2014  . Combined fat and carbohydrate induced hyperlipemia 12/31/2014  . Breast mass 06/30/2014  . Breast lump 06/30/2014     Past Surgical History  Procedure Laterality Date  . Mastectomy Left 2009  . Breast biopsy Right 1980's    negative     Current Outpatient Rx  Name  Route  Sig  Dispense  Refill  . fosinopril (MONOPRIL) 40 MG tablet   Oral   Take 40 mg by mouth daily.         . hydrochlorothiazide (MICROZIDE) 12.5 MG capsule   Oral   Take 12.5 mg by mouth daily.         Marland Kitchen latanoprost (XALATAN) 0.005 % ophthalmic solution   Both Eyes   Place 1 drop into both eyes at  bedtime.       4   . levothyroxine (SYNTHROID, LEVOTHROID) 50 MCG tablet   Oral   Take 50 mcg by mouth daily before breakfast.         . albuterol (PROVENTIL HFA;VENTOLIN HFA) 108 (90 BASE) MCG/ACT inhaler   Inhalation   Inhale 2 puffs into the lungs every 6 (six) hours as needed for wheezing or shortness of breath (inhale 2 puffs before activities.).   1 Inhaler   2   . carvedilol (COREG) 3.125 MG tablet   Oral   Take 1 tablet (3.125 mg total) by mouth 2 (two) times daily with a meal.   60 tablet   12   . fluticasone (FLONASE) 50 MCG/ACT nasal spray   Each Nare   Place 1 spray into both nostrils daily as needed.      11   . pantoprazole (PROTONIX) 40 MG tablet   Oral   Take 1 tablet (40 mg total) by mouth 2 (two) times daily.   60 tablet   11   . potassium chloride (K-DUR) 10 MEQ tablet   Oral   Take 1 tablet (10 mEq total) by mouth daily.   30 tablet   5      Allergies Review of patient's allergies indicates no known allergies.   Family History  Problem Relation Age of Onset  . Colon  cancer Mother 58  . Cancer Brother 32    lung    Social History Social History  Substance Use Topics  . Smoking status: Never Smoker   . Smokeless tobacco: Not on file  . Alcohol Use: No    Review of Systems  Constitutional:   No fever or chills. No weight changes Eyes:   No blurry vision or double vision.  ENT:   No sore throat. Cardiovascular:   No chest pain. Respiratory:   No dyspnea or cough. Gastrointestinal:   Positive for upper abdominal pain with vomiting. No change in bowel habits..  No BRBPR or melena. Genitourinary:   Negative for dysuria, urinary retention, bloody urine, or difficulty urinating. Musculoskeletal:   Negative for back pain. No joint swelling or pain. Skin:   Negative for rash. Neurological:   Negative for headaches, focal weakness or numbness. Psychiatric:  No anxiety or depression.   Endocrine:  No hot/cold intolerance, changes in  energy, or sleep difficulty.  10-point ROS otherwise negative.  ____________________________________________   PHYSICAL EXAM:  VITAL SIGNS: ED Triage Vitals  Enc Vitals Group     BP 05/19/15 2001 218/114 mmHg     Pulse Rate 05/19/15 2001 114     Resp 05/19/15 2001 22     Temp 05/19/15 2001 98.6 F (37 C)     Temp Source 05/19/15 2001 Oral     SpO2 05/19/15 2001 99 %     Weight 05/19/15 2001 100 lb (45.36 kg)     Height 05/19/15 2001 4\' 8"  (1.422 m)     Head Cir --      Peak Flow --      Pain Score 05/19/15 2004 6     Pain Loc --      Pain Edu? --      Excl. in [place]? --     Vital signs reviewed, nursing assessments reviewed.   Constitutional:   Alert and oriented. Well appearing and in no distress. Eyes:   No scleral icterus. No conjunctival pallor. PERRL. EOMI ENT   Head:   Normocephalic and atraumatic.   Nose:   No congestion/rhinnorhea. No septal hematoma   Mouth/Throat:   Dry mucous membranes, no pharyngeal erythema. No peritonsillar mass. No uvula shift.   Neck:   No stridor. No SubQ emphysema. No meningismus. Hematological/Lymphatic/Immunilogical:   No cervical lymphadenopathy. Cardiovascular:   Tachycardia heart rate 110-120. Normal and symmetric distal pulses are present in all extremities. No murmurs, rubs, or gallops. Respiratory:   Normal respiratory effort without tachypnea nor retractions. Breath sounds are clear and equal bilaterally. No wheezes/rales/rhonchi. Gastrointestinal:   Soft with mild left upper quadrant tenderness. No distention. There is no CVA tenderness.  No rebound, rigidity, or guarding. Rectal exam reveals brown stool,  Hemoccult negative, QC controls okay Genitourinary:   deferred Musculoskeletal:   Nontender with normal range of motion in all extremities. No joint effusions.  No lower extremity tenderness.  No edema. Neurologic:   Normal speech and language.  CN 2-10 normal. Motor grossly intact. No pronator drift.  Normal  gait. No gross focal neurologic deficits are appreciated.  Skin:    Skin is warm, dry and intact. No rash noted.  No petechiae, purpura, or bullae. Psychiatric:   Mood and affect are normal. Speech and behavior are normal. Patient exhibits appropriate insight and judgment.  ____________________________________________    LABS (pertinent positives/negatives) (all labs ordered are listed, but only abnormal results are displayed) Labs Reviewed  COMPREHENSIVE METABOLIC PANEL  LIPASE, BLOOD  CBC WITH DIFFERENTIAL/PLATELET  PROTIME-INR  TYPE AND SCREEN   ____________________________________________   EKG  Interpreted by me Sinus tachycardia rate 105, normal axis intervals QRS and ST segments.  There is an isolated T-wave inversion in lead 3 which is nonspecific  ____________________________________________    RADIOLOGY    ____________________________________________   PROCEDURES CRITICAL CARE Performed by: Joni Fears, Tal Kempker   Total critical care time: 35 minutes  Critical care time was exclusive of separately billable procedures and treating other patients.  Critical care was necessary to treat or prevent imminent or life-threatening deterioration.  Critical care was time spent personally by me on the following activities: development of treatment plan with patient and/or surrogate as well as nursing, discussions with consultants, evaluation of patient's response to treatment, examination of patient, obtaining history from patient or surrogate, ordering and performing treatments and interventions, ordering and review of laboratory studies, ordering and review of radiographic studies, pulse oximetry and re-evaluation of patient's condition.   ____________________________________________   INITIAL IMPRESSION / ASSESSMENT AND PLAN / ED COURSE  Pertinent labs & imaging results that were available during my care of the patient were reviewed by me and considered in my  medical decision making (see chart for details).  Patient presents with acute upper GI bleed with tachycardia and lightheadedness. Blood pressure is stable for now on there is no severe conjunctival pallor, but her symptoms and exam suggest that she is having significant blood loss. No evidence of blood passed through to the colon yet indicating that were still in the early phases of this GI bleeding event. We'll check labs including coags and type and screen. We'll give IV fluids for now, IV Protonix. Plan for admission.  ----------------------------------------- 9:19 PM on 05/19/2015 -----------------------------------------  Still tachycardic. Patient is hypertensive but we will hold off on any medications to acutely lower her blood pressure since she is likely having ongoing GI bleeding. Review of previous consult notes by Dr. Candace Cruise from gastroenterology during her last hospitalization show that her EGD revealed reflux esophagitis with a large hiatal hernia. This is Dr. Nehemiah Massed. She also annually follows up with Dr. Grayland Ormond for surveillance of previous breast cancer     ____________________________________________   FINAL CLINICAL IMPRESSION(S) / ED DIAGNOSES  Final diagnoses:  Acute upper GI bleed      Carrie Mew, MD 05/19/15 2120

## 2015-05-20 ENCOUNTER — Encounter: Payer: Self-pay | Admitting: *Deleted

## 2015-05-20 LAB — BASIC METABOLIC PANEL
Anion gap: 8 (ref 5–15)
BUN: 22 mg/dL — AB (ref 6–20)
CHLORIDE: 107 mmol/L (ref 101–111)
CO2: 29 mmol/L (ref 22–32)
CREATININE: 0.57 mg/dL (ref 0.44–1.00)
Calcium: 8.6 mg/dL — ABNORMAL LOW (ref 8.9–10.3)
Glucose, Bld: 164 mg/dL — ABNORMAL HIGH (ref 65–99)
POTASSIUM: 3.4 mmol/L — AB (ref 3.5–5.1)
SODIUM: 144 mmol/L (ref 135–145)

## 2015-05-20 LAB — MAGNESIUM: MAGNESIUM: 1.6 mg/dL — AB (ref 1.7–2.4)

## 2015-05-20 LAB — HEMOGLOBIN AND HEMATOCRIT, BLOOD
HCT: 34.3 % — ABNORMAL LOW (ref 35.0–47.0)
HCT: 37.6 % (ref 35.0–47.0)
HEMATOCRIT: 36.3 % (ref 35.0–47.0)
HEMOGLOBIN: 11.5 g/dL — AB (ref 12.0–16.0)
HEMOGLOBIN: 12.2 g/dL (ref 12.0–16.0)
Hemoglobin: 12.1 g/dL (ref 12.0–16.0)

## 2015-05-20 MED ORDER — ACETAMINOPHEN 650 MG RE SUPP: 650.0000 mg | Freq: Four times a day (QID) | RECTAL | Status: DC | PRN

## 2015-05-20 MED ORDER — SODIUM CHLORIDE 0.9 % IJ SOLN
3.0000 mL | Freq: Two times a day (BID) | INTRAMUSCULAR | Status: DC
  Administered 2015-05-20 (×3): 3 mL via INTRAVENOUS

## 2015-05-20 MED ORDER — MAGNESIUM SULFATE 2 GM/50ML IV SOLN
2.0000 g | Freq: Once | INTRAVENOUS | Status: AC
  Administered 2015-05-20: 2 g via INTRAVENOUS
  Filled 2015-05-20: qty 50

## 2015-05-20 MED ORDER — DEXTROSE-NACL 5-0.9 % IV SOLN
INTRAVENOUS | Status: DC
  Administered 2015-05-20 (×3): via INTRAVENOUS

## 2015-05-20 MED ORDER — LATANOPROST 0.005 % OP SOLN
1.0000 [drp] | Freq: Every day | OPHTHALMIC | Status: DC
  Administered 2015-05-20: 1 [drp] via OPHTHALMIC
  Filled 2015-05-20: qty 2.5

## 2015-05-20 MED ORDER — HYDRALAZINE HCL 20 MG/ML IJ SOLN
10.0000 mg | Freq: Four times a day (QID) | INTRAMUSCULAR | Status: DC | PRN
  Administered 2015-05-21: 10 mg via INTRAVENOUS
  Filled 2015-05-20 (×2): qty 1

## 2015-05-20 MED ORDER — ACETAMINOPHEN 325 MG PO TABS: 650.0000 mg | ORAL_TABLET | Freq: Four times a day (QID) | ORAL | Status: DC | PRN

## 2015-05-20 MED ORDER — SUCRALFATE 1 GM/10ML PO SUSP
1.0000 g | Freq: Four times a day (QID) | ORAL | Status: DC
  Administered 2015-05-20 – 2015-05-21 (×3): 1 g via ORAL
  Filled 2015-05-20 (×6): qty 10

## 2015-05-20 NOTE — Consult Note (Signed)
Patient with hx of large hiatal hernia, now larger on CT scan.  Black emesis which has stopped. Last EGD was 2 years ago.  Had grade C esophagitis and large Hiatal hernia then.  Carafate slurry ordered and on Protonix bid.  Exam shows abd non tender, chest clear, heart 2/6 SEM. Water only for now.  Will advance to clear liquids in morning.  See complete note Claudie Leach, PA.  No plans for EGD, likely [place] erosions from hiatal hernia with some bleeding, last hgb 11.5.

## 2015-05-20 NOTE — Progress Notes (Signed)
Patient arrived to unit from ED. Patient alert and oriented. Patient denies n/v or bloody stools. Skin CDI. IV fluids infusing. Cardiac monitor placed.  Patient teaching giving NPO status maintained.

## 2015-05-20 NOTE — Progress Notes (Signed)
Krista Randall at [place] NAME: Krista Randall    MR#:  [id number]  DATE OF BIRTH:  08-22-1920  SUBJECTIVE:  CHIEF COMPLAINT:   Chief Complaint  Patient presents with  . GI Bleeding   no nausea or vomiting or hematemesis today. No melena or bloody stool.  REVIEW OF SYSTEMS:  CONSTITUTIONAL: No fever, has weakness.  EYES: No blurred or double vision.  EARS, NOSE, AND THROAT: No tinnitus or ear pain.  RESPIRATORY: No cough, shortness of breath, wheezing or hemoptysis.  CARDIOVASCULAR: No chest pain, orthopnea, edema.  GASTROINTESTINAL: No nausea, vomiting, diarrhea or abdominal pain. No melena or bloody stool GENITOURINARY: No dysuria, hematuria.  ENDOCRINE: No polyuria, nocturia,  HEMATOLOGY: No anemia, easy bruising or bleeding SKIN: No rash or lesion. MUSCULOSKELETAL: No joint pain or arthritis.   NEUROLOGIC: No tingling, numbness, weakness.  PSYCHIATRY: No anxiety or depression.   DRUG ALLERGIES:  No Known Allergies  VITALS:  Blood pressure 171/60, pulse 74, temperature 98.6 F (37 C), temperature source Oral, resp. rate 18, height 4\' 8"  (1.422 m), weight 46.227 kg (101 lb 14.6 oz), SpO2 97 %.  PHYSICAL EXAMINATION:  GENERAL:  82 y.o.-year-old patient lying in the bed with no acute distress.  EYES: Pupils equal, round, reactive to light and accommodation. No scleral icterus. Extraocular muscles intact.  HEENT: Head atraumatic, normocephalic. Oropharynx and nasopharynx clear.  NECK:  Supple, no jugular venous distention. No thyroid enlargement, no tenderness.  LUNGS: Normal breath sounds bilaterally, no wheezing, rales,rhonchi or crepitation. No use of accessory muscles of respiration.  CARDIOVASCULAR: S1, S2 normal. No murmurs, rubs, or gallops.  ABDOMEN: Soft, nontender, nondistended. Bowel sounds present. No organomegaly or mass.  EXTREMITIES: No pedal edema, cyanosis, or clubbing.  NEUROLOGIC: Cranial nerves II through XII are  intact. Muscle strength 4/5 in all extremities. Sensation intact. Gait not checked.  PSYCHIATRIC: The patient is alert and oriented x 3.  SKIN: No obvious rash, lesion, or ulcer.    LABORATORY PANEL:   CBC  Recent Labs Lab 05/19/15 2040  05/20/15 1158  WBC 12.5*  --   --   HGB 14.4  < > 11.5*  HCT 44.2  < > 34.3*  PLT 304  --   --   < > = values in this interval not displayed. ------------------------------------------------------------------------------------------------------------------  Chemistries   Recent Labs Lab 05/19/15 2040 05/20/15 0551 05/20/15 1158  NA 144 144  --   K 3.2* 3.4*  --   CL 96* 107  --   CO2 36* 29  --   GLUCOSE 190* 164*  --   BUN 15 22*  --   CREATININE 0.83 0.57  --   CALCIUM 10.0 8.6*  --   MG  --   --  1.6*  AST 27  --   --   ALT 19  --   --   ALKPHOS 97  --   --   BILITOT 0.8  --   --    ------------------------------------------------------------------------------------------------------------------  Cardiac Enzymes No results for input(s): TROPONINI in the last 168 hours. ------------------------------------------------------------------------------------------------------------------  RADIOLOGY:  Dg Chest Portable 1 View  05/19/2015  CLINICAL DATA:  Abdominal pain with coffee ground emesis. History of GI bleed. EXAM: PORTABLE CHEST 1 VIEW COMPARISON:  12/22/2014 and 04/01/2011. FINDINGS: 2041 hours. The heart is enlarged. There is a large hiatal hernia which has mildly enlarged. There is increased left lower lobe opacity, probably passive atelectasis. The right lung appears clear. There is  no pleural effusion or pneumothorax. The bones appear unchanged. IMPRESSION: Enlarging, large hiatal hernia with increasing probable left lower lobe atelectasis. Electronically Signed   By: Richardean Sale M.D.   On: 05/19/2015 21:09    EKG:   Orders placed or performed during the hospital encounter of 05/19/15  . ED EKG  . ED EKG  . EKG  12-Lead  . EKG 12-Lead    ASSESSMENT AND PLAN:   1. Acute GIB with Hematemesis. Continue Protonix iv BID. Follow-up hemoglobin and GI recommendation. 2. Intractable nausea vomiting Zofran when necessary. IV fluid support.  3. Hypokalemia, given potassium IV and follow-up BMP. 4. Hypomagnesemia. Give IV magnesium and a follow-up level.   Discussed with the GI PA.  All the records are reviewed and case discussed with Care Management/Social Workerr. Management plans discussed with the patient, her daughter and they are in agreement.  CODE STATUS: Full code  TOTAL TIME TAKING CARE OF THIS PATIENT: 38 minutes.  Greater than 50% time was spent on coordination of care and face-to-face counseling.  POSSIBLE D/C IN 2 DAYS, DEPENDING ON CLINICAL CONDITION.   Demetrios Loll M.D on 05/20/2015 at 5:00 PM  Between 7am to 6pm - Pager - [phone]  After 6pm go to [email or web address] - password [email or web address] [facility]  Office  [phone]  CC: Primary care physician; Dicky Doe, MD

## 2015-05-20 NOTE — Consult Note (Signed)
GI Inpatient Consult Note  Reason for Consult: GI Bleed   Attending Requesting Consult: Dr. Bridgett Larsson  History of Present Illness: Krista Randall is a 82 y.o. female with a significant past medical history of breast cancer, hiatal hernia reports that she had multiple episodes of vomiting yesterday.  She reports it began 30 minutes after eating some soup, denies vomiting undigested food, began vomiting black emesis.  She continued to vomit every 15-20 minutes.  Her last 2 episodes were in the emergency department.    She reports she has had some epigastric discomfort, mainly after eating.  She reports it being hard to burp, discomfort is relieved with burping, she reports it has hurt for a while but unsure of length of time.  She takes Protonix 40 mg daily, will use Carafate once every 2-3 days.  She reports her abdominal pain has currently resolved and her nausea is under control.  She does endorse occasional use of Aleve.  She denies any changes in her weight or bowel movements.  She reports an episode similar to this approximately 2 years ago, but reports it was not as bad as this episode.  She had an upper endoscopy completed at that time as an inpatient on July 13, 2013 with Dr. Candace Cruise.  Findings: LA grade C esophagitis was found in the lower third of the esophagus.  Biopsies were taken with cold forceps for histology.  Examination is otherwise without abnormality.  A large hiatus hernia was present.  The examined duodenum was normal.  She is followed by Dr. Nehemiah Massed for moderate nonrheumatic tricuspid valve insufficiency, she reports chronic shortness of breath.   Past Medical History:  Past Medical History  Diagnosis Date  . Breast cancer ([place]) 2009    left breast  . Breast mass march 2016    rt lump @ [place]   . Hernia, hiatal     Problem List: Patient Active Problem List   Diagnosis Date Noted  . GI bleeding 05/19/2015  . Awareness of heartbeats 01/22/2015  . Pre-diabetes  01/22/2015  . Need for influenza vaccination 01/22/2015  . Hypertensive pulmonary vascular disease ([place]) 01/15/2015  . Dizziness 01/15/2015  . MI (mitral incompetence) 01/15/2015  . TI (tricuspid incompetence) 01/15/2015  . Benign essential HTN 12/31/2014  . Combined fat and carbohydrate induced hyperlipemia 12/31/2014  . Breast mass 06/30/2014  . Breast lump 06/30/2014    Past Surgical History: Past Surgical History  Procedure Laterality Date  . Mastectomy Left 2009  . Breast biopsy Right 1980's    negative  . Cholecystectomy      Allergies: No Known Allergies  Home Medications: Prescriptions prior to admission  Medication Sig Dispense Refill Last Dose  . acidophilus (RISAQUAD) CAPS capsule Take 1 capsule by mouth daily.   05/19/2015 at Unknown time  . carvedilol (COREG) 3.125 MG tablet Take 1 tablet (3.125 mg total) by mouth 2 (two) times daily with a meal. 60 tablet 12 05/19/2015 at 0730  . ferrous sulfate 325 (65 FE) MG tablet Take 325 mg by mouth daily with breakfast.   05/19/2015 at Unknown time  . fluticasone (FLONASE) 50 MCG/ACT nasal spray Place 1 spray into both nostrils daily as needed for rhinitis.   11 05/18/2015 at Unknown time  . fosinopril (MONOPRIL) 40 MG tablet Take 40 mg by mouth daily.   05/19/2015 at Unknown time  . hydrochlorothiazide (MICROZIDE) 12.5 MG capsule Take 12.5 mg by mouth daily.   05/19/2015 at Unknown time  . latanoprost (  XALATAN) 0.005 % ophthalmic solution Place 1 drop into both eyes at bedtime.   4 05/18/2015 at Unknown time  . levothyroxine (SYNTHROID, LEVOTHROID) 50 MCG tablet Take 50 mcg by mouth daily before breakfast.   05/19/2015 at Unknown time  . pantoprazole (PROTONIX) 40 MG tablet Take 40 mg by mouth daily.   05/19/2015 at Unknown time  . potassium chloride (K-DUR) 10 MEQ tablet Take 1 tablet (10 mEq total) by mouth daily. 30 tablet 5 05/19/2015 at Unknown time  . albuterol (PROVENTIL HFA;VENTOLIN HFA) 108 (90 BASE) MCG/ACT inhaler Inhale 2  puffs into the lungs every 6 (six) hours as needed for wheezing or shortness of breath (inhale 2 puffs before activities.). (Patient not taking: Reported on 05/19/2015) 1 Inhaler 2 Taking   Home medication reconciliation was completed with the patient.   Scheduled Inpatient Medications:   . latanoprost  1 drop Both Eyes QHS  . pantoprazole (PROTONIX) IV  40 mg Intravenous Q12H  . sodium chloride  3 mL Intravenous Q12H    Continuous Inpatient Infusions:   . dextrose 5 % and 0.9% NaCl 125 mL/hr at 05/20/15 1107    PRN Inpatient Medications:  acetaminophen **OR** acetaminophen, hydrALAZINE, ondansetron (ZOFRAN) IV  Family History: family history includes Cancer (age of onset: 76) in her brother; Colon cancer (age of onset: 71) in her mother; Diabetes Mellitus I in her daughter.   Social History:   reports that she has never smoked. She does not have any smokeless tobacco history on file. She reports that she does not drink alcohol or use illicit drugs.    Review of Systems: Constitutional: Weight is stable.  Eyes: No changes in vision. ENT: No oral lesions, sore throat.  GI: see HPI.  Heme/Lymph: No easy bruising.  CV: No chest pain.  GU: No hematuria.  Integumentary: No rashes.  Neuro: No headaches.  Psych: No depression/anxiety.  Endocrine: No heat/cold intolerance.  Allergic/Immunologic: No urticaria.  Resp: No cough  Musculoskeletal: No joint swelling.    Physical Examination: BP 137/47 mmHg  Pulse 66  Temp(Src) 98.4 F (36.9 C) (Oral)  Resp 18  Ht 4\' 8"  (1.422 m)  Wt 46.227 kg (101 lb 14.6 oz)  BMI 22.86 kg/m2  SpO2 96% Gen: NAD, alert and oriented x 4, very pleasant, daughter and pastor are bedside HEENT: PEERLA, EOMI, Neck: supple, no JVD or thyromegaly Chest: CTA bilaterally, no wheezes, crackles, or other adventitious sounds CV: RRR, no m/g/c/r Abd: soft, NT, ND, +BS in all four quadrants; no HSM, guarding, ridigity, or rebound tenderness Ext: no edema,  well perfused with 2+ pulses, Skin: no rash or lesions noted Lymph: no LAD  Data: Lab Results  Component Value Date   WBC 12.5* 05/19/2015   HGB 12.1 05/20/2015   HCT 36.3 05/20/2015   MCV 88.5 05/19/2015   PLT 304 05/19/2015    Recent Labs Lab 05/19/15 2040 05/20/15 0035 05/20/15 0551  HGB 14.4 12.2 12.1   Lab Results  Component Value Date   NA 144 05/20/2015   K 3.4* 05/20/2015   CL 107 05/20/2015   CO2 29 05/20/2015   BUN 22* 05/20/2015   CREATININE 0.57 05/20/2015   Lab Results  Component Value Date   ALT 19 05/19/2015   AST 27 05/19/2015   ALKPHOS 97 05/19/2015   BILITOT 0.8 05/19/2015    Recent Labs Lab 05/19/15 2040  INR 1.01    Imaging: CLINICAL DATA: Abdominal pain with coffee ground emesis. History of GI bleed.  EXAM: PORTABLE  CHEST 1 VIEW  COMPARISON: 12/22/2014 and 04/01/2011.  FINDINGS: 2041 hours. The heart is enlarged. There is a large hiatal hernia which has mildly enlarged. There is increased left lower lobe opacity, probably passive atelectasis. The right lung appears clear. There is no pleural effusion or pneumothorax. The bones appear unchanged.  IMPRESSION: Enlarging, large hiatal hernia with increasing probable left lower lobe atelectasis.   Electronically Signed  By: Richardean Sale M.D.  On: 05/19/2015 21:09  Assessment/Plan: Ms. Prasse is a 82 y.o. female with large hiatal hernia with multiple episodes of vomiting black emesis. Her hemoglobin on admission was 12.2, currently 11.5.  Her BUN 22, creatinine 0.57.  Similar episode in 2013, EGD showed LA grade C esophagitis.  Is being treated with Protonix, Zofran and one dose of Reglan.   Recommendations: This is more than likely a result of her large hiatal hernia, possibly a Cameron erosion.  Would continue current treatment and recommend adding Carafate 4 times daily.  We will continue to follow with you. Thank you for the consult. Please call with  questions or concerns.  Salvadore Farber, PA-C  I personally performed these services.

## 2015-05-21 LAB — CBC
HEMATOCRIT: 36.9 % (ref 35.0–47.0)
HEMOGLOBIN: 12.3 g/dL (ref 12.0–16.0)
MCH: 30.4 pg (ref 26.0–34.0)
MCHC: 33.4 g/dL (ref 32.0–36.0)
MCV: 91 fL (ref 80.0–100.0)
Platelets: 230 10*3/uL (ref 150–440)
RBC: 4.06 MIL/uL (ref 3.80–5.20)
RDW: 14.4 % (ref 11.5–14.5)
WBC: 5.4 10*3/uL (ref 3.6–11.0)

## 2015-05-21 LAB — BASIC METABOLIC PANEL
Anion gap: 7 (ref 5–15)
Anion gap: 7 (ref 5–15)
BUN: 10 mg/dL (ref 6–20)
BUN: 9 mg/dL (ref 6–20)
CHLORIDE: 110 mmol/L (ref 101–111)
CHLORIDE: 111 mmol/L (ref 101–111)
CO2: 27 mmol/L (ref 22–32)
CO2: 24 mmol/L (ref 22–32)
CREATININE: 0.48 mg/dL (ref 0.44–1.00)
Calcium: 8.4 mg/dL — ABNORMAL LOW (ref 8.9–10.3)
Calcium: 8.6 mg/dL — ABNORMAL LOW (ref 8.9–10.3)
Creatinine, Ser: 0.51 mg/dL (ref 0.44–1.00)
GFR calc non Af Amer: >60 mL/min (ref >60)
GLUCOSE: 111 mg/dL — AB (ref 65–99)
Glucose, Bld: 109 mg/dL — ABNORMAL HIGH (ref 65–99)
POTASSIUM: 4 mmol/L (ref 3.5–5.1)
Potassium: 2.8 mmol/L — CL (ref 3.5–5.1)
SODIUM: 142 mmol/L (ref 135–145)
Sodium: 144 mmol/L (ref 135–145)

## 2015-05-21 LAB — MAGNESIUM: Magnesium: 2.4 mg/dL (ref 1.7–2.4)

## 2015-05-21 MED ORDER — POTASSIUM CHLORIDE ER 10 MEQ PO TBCR: 10.0000 meq | EXTENDED_RELEASE_TABLET | Freq: Every day | ORAL | Status: DC

## 2015-05-21 MED ORDER — SUCRALFATE 1 GM/10ML PO SUSP: 1.0000 g | Freq: Four times a day (QID) | ORAL | Status: DC

## 2015-05-21 MED ORDER — METOPROLOL TARTRATE 25 MG PO TABS
25.0000 mg | ORAL_TABLET | Freq: Two times a day (BID) | ORAL | Status: DC
  Administered 2015-05-21: 25 mg via ORAL
  Filled 2015-05-21: qty 1

## 2015-05-21 MED ORDER — POTASSIUM CHLORIDE CRYS ER 20 MEQ PO TBCR
40.0000 meq | EXTENDED_RELEASE_TABLET | Freq: Once | ORAL | Status: AC
  Administered 2015-05-21: 40 meq via ORAL
  Filled 2015-05-21: qty 2

## 2015-05-21 MED ORDER — POTASSIUM CHLORIDE 10 MEQ/100ML IV SOLN
10.0000 meq | INTRAVENOUS | Status: AC
  Administered 2015-05-21 (×2): 10 meq via INTRAVENOUS
  Filled 2015-05-21 (×2): qty 100

## 2015-05-21 NOTE — Progress Notes (Signed)
Patient discharged to home. Discharge & Rx instructions given to patient and daughters. IV removed and belongings packed.

## 2015-05-21 NOTE — Discharge Instructions (Signed)
Heart healthy soft diet. Activity as tolerated.

## 2015-05-21 NOTE — Discharge Summary (Signed)
[facility] [place] NAME: Krista Randall    MR#:  [id number]  DATE OF BIRTH:  [birth year]  DATE OF ADMISSION:  05/19/2015 ADMITTING PHYSICIAN: Saundra Shelling, MD  DATE OF DISCHARGE: 05/21/2015  4:18 PM  PRIMARY CARE PHYSICIAN: Dicky Doe, MD    ADMISSION DIAGNOSIS:  Acute upper GI bleed [K92.2]   DISCHARGE DIAGNOSIS:  Acute GIB with Hematemesis  SECONDARY DIAGNOSIS:   Past Medical History  Diagnosis Date  . Breast cancer ([place]) 2009    left breast  . Breast mass march 2016    rt lump @ [place]   . Hernia, hiatal     HOSPITAL COURSE:   1. Acute GIB with Hematemesis. likely Cameron erosions from hiatal hernia with some bleeding, no EGD workup per Dr. Tiffany Kocher. Continue Protonix and carafate, hemoglobin is stable. 2. Intractable nausea vomiting, improved, treated with Zofran when necessary. IV fluid support. 3. Hypokalemia, improved with potassium. 4. Hypomagnesemia. Given IV magnesium and improved.  DISCHARGE CONDITIONS:   Stable, discharged home today.  CONSULTS OBTAINED:  Treatment Team:  Manya Silvas, MD  DRUG ALLERGIES:  No Known Allergies  DISCHARGE MEDICATIONS:   Discharge Medication List as of 05/21/2015  3:23 PM    START taking these medications   Details  sucralfate (CARAFATE) 1 GM/10ML suspension Take 10 mLs (1 g total) by mouth 4 (four) times daily., Starting 05/21/2015, Until Discontinued, Print      CONTINUE these medications which have CHANGED   Details  potassium chloride (K-DUR) 10 MEQ tablet Take 1 tablet (10 mEq total) by mouth daily., Starting 05/21/2015, Until Discontinued, Print      CONTINUE these medications which have NOT CHANGED   Details  acidophilus (RISAQUAD) CAPS capsule Take 1 capsule by mouth daily., Until Discontinued, Historical Med    carvedilol (COREG) 3.125 MG tablet Take 1 tablet (3.125 mg total) by mouth 2 (two) times daily with a meal., Starting 01/22/2015, Until  Discontinued, Normal    ferrous sulfate 325 (65 FE) MG tablet Take 325 mg by mouth daily with breakfast., Until Discontinued, Historical Med    fluticasone (FLONASE) 50 MCG/ACT nasal spray Place 1 spray into both nostrils daily as needed for rhinitis. , Until Discontinued, Historical Med    fosinopril (MONOPRIL) 40 MG tablet Take 40 mg by mouth daily., Until Discontinued, Historical Med    hydrochlorothiazide (MICROZIDE) 12.5 MG capsule Take 12.5 mg by mouth daily., Until Discontinued, Historical Med    latanoprost (XALATAN) 0.005 % ophthalmic solution Place 1 drop into both eyes at bedtime. , Until Discontinued, Historical Med    levothyroxine (SYNTHROID, LEVOTHROID) 50 MCG tablet Take 50 mcg by mouth daily before breakfast., Until Discontinued, Historical Med    pantoprazole (PROTONIX) 40 MG tablet Take 40 mg by mouth daily., Until Discontinued, Historical Med    albuterol (PROVENTIL HFA;VENTOLIN HFA) 108 (90 BASE) MCG/ACT inhaler Inhale 2 puffs into the lungs every 6 (six) hours as needed for wheezing or shortness of breath (inhale 2 puffs before activities.)., Starting 12/23/2014, Until Discontinued, Normal         DISCHARGE INSTRUCTIONS:    If you experience worsening of your admission symptoms, develop shortness of breath, life threatening emergency, suicidal or homicidal thoughts you must seek medical attention immediately by calling 911 or calling your MD immediately  if symptoms less severe.  You Must read complete instructions/literature along with all the possible adverse reactions/side effects for all the Medicines you take and  that have been prescribed to you. Take any new Medicines after you have completely understood and accept all the possible adverse reactions/side effects.   Please note  You were cared for by a hospitalist during your hospital stay. If you have any questions about your discharge medications or the care you received while you were in the hospital after  you are discharged, you can call the unit and asked to speak with the hospitalist on call if the hospitalist that took care of you is not available. Once you are discharged, your primary care physician will handle any further medical issues. Please note that NO REFILLS for any discharge medications will be authorized once you are discharged, as it is imperative that you return to your primary care physician (or establish a relationship with a primary care physician if you do not have one) for your aftercare needs so that they can reassess your need for medications and monitor your lab values.    Today   SUBJECTIVE   No complaint.   VITAL SIGNS:  Blood pressure 186/81, pulse 77, temperature 99.4 F (37.4 C), temperature source Oral, resp. rate 18, height 4\' 8"  (1.422 m), weight 46.227 kg (101 lb 14.6 oz), SpO2 96 %.  I/O:  No intake or output data in the 24 hours ending 05/21/15 1645  PHYSICAL EXAMINATION:  GENERAL:  82 y.o.-year-old patient lying in the bed with no acute distress.  EYES: Pupils equal, round, reactive to light and accommodation. No scleral icterus. Extraocular muscles intact.  HEENT: Head atraumatic, normocephalic. Oropharynx and nasopharynx clear.  NECK:  Supple, no jugular venous distention. No thyroid enlargement, no tenderness.  LUNGS: Normal breath sounds bilaterally, no wheezing, rales,rhonchi or crepitation. No use of accessory muscles of respiration.  CARDIOVASCULAR: S1, S2 normal. No murmurs, rubs, or gallops.  ABDOMEN: Soft, non-tender, non-distended. Bowel sounds present. No organomegaly or mass.  EXTREMITIES: No pedal edema, cyanosis, or clubbing.  NEUROLOGIC: Cranial nerves II through XII are intact. Muscle strength 5/5 in all extremities. Sensation intact. Gait not checked.  PSYCHIATRIC: The patient is alert and oriented x 3.  SKIN: No obvious rash, lesion, or ulcer.   DATA REVIEW:   CBC  Recent Labs Lab 05/21/15 0459  WBC 5.4  HGB 12.3  HCT 36.9   PLT 230    Chemistries   Recent Labs Lab 05/19/15 2040  05/21/15 0459 05/21/15 1141  NA 144  < > 144 142  K 3.2*  < > 2.8* 4.0  CL 96*  < > 110 111  CO2 36*  < > 27 24  GLUCOSE 190*  < > 111* 109*  BUN 15  < > 10 9  CREATININE 0.83  < > 0.51 0.48  CALCIUM 10.0  < > 8.4* 8.6*  MG  --   < > 2.4  --   AST 27  --   --   --   ALT 19  --   --   --   ALKPHOS 97  --   --   --   BILITOT 0.8  --   --   --   < > = values in this interval not displayed.  Cardiac Enzymes No results for input(s): TROPONINI in the last 168 hours.  Microbiology Results  No results found for this or any previous visit.  RADIOLOGY:  Dg Chest Portable 1 View  05/19/2015  CLINICAL DATA:  Abdominal pain with coffee ground emesis. History of GI bleed. EXAM: PORTABLE CHEST 1 VIEW COMPARISON:  12/22/2014 and 04/01/2011. FINDINGS: 2041 hours. The heart is enlarged. There is a large hiatal hernia which has mildly enlarged. There is increased left lower lobe opacity, probably passive atelectasis. The right lung appears clear. There is no pleural effusion or pneumothorax. The bones appear unchanged. IMPRESSION: Enlarging, large hiatal hernia with increasing probable left lower lobe atelectasis. Electronically Signed   By: Richardean Sale M.D.   On: 05/19/2015 21:09       I discussed with Dr. Tiffany Kocher. Management plans discussed with the patient, family and they are in agreement.  CODE STATUS:     Code Status Orders        Start     Ordered   05/20/15 0159  Full code   Continuous     05/20/15 0159    Code Status History    Date Active Date Inactive Code Status Order ID Comments User Context   This patient has a current code status but no historical code status.      TOTAL TIME TAKING CARE OF THIS PATIENT: 35 minutes.    Demetrios Loll M.D on 05/21/2015 at 4:45 PM  Between 7am to 6pm - Pager - [phone]  After 6pm go to [email or web address] - password [email or web address] [facility]  Office   [phone]  CC: Primary care physician; Dicky Doe, MD

## 2015-05-21 NOTE — Evaluation (Signed)
Physical Therapy Evaluation Patient Details Name: Krista Randall MRN: [id number] DOB: [birth year] Today's Date: 05/21/2015   History of Present Illness  Pt is a 82 y.o. female presenting to hospital with nausea and vomiting.  Pt admitted with acute GI bleed, hematemesis, intractable nausea/vomiting, and hypokalemia.  Clinical Impression  Pt demonstrates steady modified independent functional mobility with RW (pt uses rollator at home and appears this will be appropriate for pt based on functional mobility and balance during session).  Pt appears close to her baseline in terms of functional mobility and appears safe to discharge home with support of family when medically appropriate.  No PT needs identified.  Will complete PT order and discharge pt from PT in house.    Follow Up Recommendations No PT follow up    Equipment Recommendations  None recommended by PT (Pt already owns [facility] and rollator)    Recommendations for Other Services       Precautions / Restrictions Precautions Precautions: None Restrictions Weight Bearing Restrictions: No      Mobility  Bed Mobility Overal bed mobility: Modified Independent             General bed mobility comments: Supine to/from sit with HOB elevated  Transfers Overall transfer level: Modified independent Equipment used: Rolling walker (2 wheeled)             General transfer comment: steady and strong stand without loss of balance  Ambulation/Gait Ambulation/Gait assistance: Modified independent (Device/Increase time) Ambulation Distance (Feet): 180 Feet Assistive device: Rolling walker (2 wheeled) Gait Pattern/deviations: Step-through pattern   Gait velocity interpretation: at or above normal speed for age/gender General Gait Details: steady without loss of balance  Stairs Stairs: Yes Stairs assistance: Supervision Stair Management: Two rails;Alternating pattern Number of Stairs: 2 General stair comments: steady  without loss of balance  Wheelchair Mobility    Modified Rankin (Stroke Patients Only)       Balance Overall balance assessment: Modified Independent                                           Pertinent Vitals/Pain Pain Assessment: No/denies pain  Vitals stable and WFL throughout treatment session.    Home Living Family/patient expects to be discharged to:: Private residence Living Arrangements: Alone Available Help at Discharge: Family   Home Access: Stairs to enter Entrance Stairs-Rails: Right;Left;Can reach both Entrance Stairs-Number of Steps: 2 Home Layout: One level Home Equipment: [place] - 4 wheels;Cane - single point      Prior Function Level of Independence: Independent with assistive device(s);Needs assistance   Gait / Transfers Assistance Needed: Pt uses 4ww in home and SPC outside.  ADL's / Homemaking Assistance Needed: Assist for taking out trash and getting mail.  Comments: Pt denies any falls in past 6 months.     Hand Dominance        Extremity/Trunk Assessment   Upper Extremity Assessment: Overall WFL for tasks assessed           Lower Extremity Assessment: Overall WFL for tasks assessed         Communication   Communication: HOH  Cognition Arousal/Alertness: Awake/alert Behavior During Therapy: WFL for tasks assessed/performed Overall Cognitive Status: Within Functional Limits for tasks assessed                      General Comments   Nursing  cleared pt for participation in physical therapy.  Pt agreeable to PT session. Pt's daughter present during session.    Exercises        Assessment/Plan    PT Assessment Patent does not need any further PT services  PT Diagnosis     PT Problem List    PT Treatment Interventions     PT Goals (Current goals can be found in the Care Plan section) Acute Rehab PT Goals Patient Stated Goal: to go home PT Goal Formulation: With patient Time For Goal  Achievement: 06/04/15 Potential to Achieve Goals: Good    Frequency     Barriers to discharge        Co-evaluation               End of Session Equipment Utilized During Treatment: Gait belt Activity Tolerance: Patient tolerated treatment well Patient left: in bed;with call bell/phone within reach;with family/visitor present Nurse Communication: Mobility status         Time: 1400-1420 PT Time Calculation (min) (ACUTE ONLY): 20 min   Charges:   PT Evaluation $PT Eval Low Complexity: 1 Procedure     PT G CodesLeitha Bleak 29-May-2015, 3:31 PM Leitha Bleak, [place]

## 2015-05-21 NOTE — Progress Notes (Signed)
Lab called with a critical Potassium of 2.8. Dr called new orders given.

## 2015-05-21 NOTE — Care Management Important Message (Signed)
Important Message  Patient Details  Name: Krista Randall MRN: [id number] Date of Birth: 1920/07/06   Medicare Important Message Given:  Yes    Juliann Pulse A Suzzane Quilter 05/21/2015, 11:31 AM

## 2015-05-21 NOTE — Consult Note (Signed)
Pt without further bleeding, no vomiting.  Hgb 12.3, Plt 230K, BP variable with range of 114/100 to 200/157. Sat 97%.  Can likely go home unless further hematemesis occurs.  She should eat very soft diet and take PPI and Carafate for stomach.

## 2015-05-21 NOTE — Progress Notes (Signed)
Messaged pharmacy to re-schedule 830 dose of K

## 2015-05-24 ENCOUNTER — Ambulatory Visit (INDEPENDENT_AMBULATORY_CARE_PROVIDER_SITE_OTHER): Payer: Medicare Other | Admitting: Family Medicine

## 2015-05-24 ENCOUNTER — Encounter: Payer: Self-pay | Admitting: Family Medicine

## 2015-05-24 VITALS — BP 180/85 | HR 70 | Temp 97.9°F | Ht <= 58 in | Wt 98.0 lb

## 2015-05-24 DIAGNOSIS — I10 Essential (primary) hypertension: Secondary | ICD-10-CM

## 2015-05-24 DIAGNOSIS — K922 Gastrointestinal hemorrhage, unspecified: Secondary | ICD-10-CM | POA: Diagnosis not present

## 2015-05-24 MED ORDER — SUCRALFATE 1 GM/10ML PO SUSP: 1.0000 g | Freq: Four times a day (QID) | ORAL | Status: DC

## 2015-05-24 MED ORDER — CARVEDILOL 3.125 MG PO TABS: 3.1250 mg | ORAL_TABLET | Freq: Two times a day (BID) | ORAL | Status: DC

## 2015-05-24 NOTE — Progress Notes (Signed)
Name: Krista Randall   MRN: [id number]    DOB: 28-Dec-1920   Date:05/24/2015       Progress Note  Subjective  Chief Complaint  Chief Complaint  Patient presents with  . Hospitalization Follow-up    GI bleed    HPI Here for f/u of GI bleed.  Had gastric bleed .  Hosp on 05/19/15-05/21/15.  Hb was 12.3 on discharge.  Her N/V had resolved.  She still c/o being "dizzy" all the time.   No problem-specific assessment & plan notes found for this encounter.   Past Medical History  Diagnosis Date  . Breast cancer ([place]) 2009    left breast  . Breast mass march 2016    rt lump @ [place]   . Hernia, hiatal     Past Surgical History  Procedure Laterality Date  . Mastectomy Left 2009  . Breast biopsy Right 1980's    negative  . Cholecystectomy      Family History  Problem Relation Age of Onset  . Colon cancer Mother 55  . Cancer Brother 63    lung  . Diabetes Mellitus I Daughter     Social History   Social History  . Marital Status: Widowed    Spouse Name: N/A  . Number of Children: N/A  . Years of Education: N/A   Occupational History  . retired    Social History Main Topics  . Smoking status: Never Smoker   . Smokeless tobacco: Never Used  . Alcohol Use: No  . Drug Use: No  . Sexual Activity: Not on file   Other Topics Concern  . Not on file   Social History Narrative   Lives alone     Current outpatient prescriptions:  .  acidophilus (RISAQUAD) CAPS capsule, Take 1 capsule by mouth daily., Disp: , Rfl:  .  albuterol (PROVENTIL HFA;VENTOLIN HFA) 108 (90 BASE) MCG/ACT inhaler, Inhale 2 puffs into the lungs every 6 (six) hours as needed for wheezing or shortness of breath (inhale 2 puffs before activities.)., Disp: 1 Inhaler, Rfl: 2 .  ferrous sulfate 325 (65 FE) MG tablet, Take 325 mg by mouth daily with breakfast., Disp: , Rfl:  .  fluticasone (FLONASE) 50 MCG/ACT nasal spray, Place 1 spray into both nostrils daily as needed for rhinitis. , Disp: , Rfl:  11 .  fosinopril (MONOPRIL) 40 MG tablet, Take 40 mg by mouth daily., Disp: , Rfl:  .  hydrochlorothiazide (MICROZIDE) 12.5 MG capsule, Take 12.5 mg by mouth daily., Disp: , Rfl:  .  latanoprost (XALATAN) 0.005 % ophthalmic solution, Place 1 drop into both eyes at bedtime. , Disp: , Rfl: 4 .  levothyroxine (SYNTHROID, LEVOTHROID) 50 MCG tablet, Take 50 mcg by mouth daily before breakfast., Disp: , Rfl:  .  pantoprazole (PROTONIX) 40 MG tablet, Take 40 mg by mouth daily., Disp: , Rfl:  .  potassium chloride (K-DUR) 10 MEQ tablet, Take 1 tablet (10 mEq total) by mouth daily., Disp: 30 tablet, Rfl: 0 .  Probiotic Product (DIGESTIVE ADVANTAGE PO), Take 1 tablet by mouth daily., Disp: , Rfl:  .  sucralfate (CARAFATE) 1 GM/10ML suspension, Take 10 mLs (1 g total) by mouth 4 (four) times daily., Disp: 120 mL, Rfl: 6 .  carvedilol (COREG) 3.125 MG tablet, Take 1 tablet (3.125 mg total) by mouth 2 (two) times daily with a meal., Disp: 60 tablet, Rfl: 12 .  levothyroxine (SYNTHROID, LEVOTHROID) 75 MCG tablet, Take 75 mcg by mouth daily., Disp: ,  Rfl: 11  Not on File   Review of Systems  Constitutional: Positive for weight loss. Negative for fever, chills and malaise/fatigue.  HENT: Negative for hearing loss.   Eyes: Negative for blurred vision and double vision.  Respiratory: Negative for cough, shortness of breath and wheezing.   Cardiovascular: Negative for chest pain, palpitations and leg swelling.  Gastrointestinal: Positive for nausea (resolved) and blood in stool (resolved). Vomiting: resolved.  Genitourinary: Negative for dysuria, urgency and frequency.  Skin: Negative for rash.  Neurological: Negative for weakness and headaches.      Objective  Filed Vitals:   05/24/15 1042 05/24/15 1149  BP: 184/74 180/85  Pulse: 70   Temp: 97.9 F (36.6 C)   Height: 4' 8" (1.422 m)   Weight: 98 lb (44.453 kg)     Physical Exam  Constitutional: She is oriented to person, place, and time and  well-developed, well-nourished, and in no distress. No distress.  HENT:  Head: Normocephalic and atraumatic.  Eyes: Conjunctivae and EOM are normal. Pupils are equal, round, and reactive to light. No scleral icterus.  Neck: Normal range of motion. Neck supple. Carotid bruit is not present. No thyromegaly present.  Cardiovascular: Normal rate and regular rhythm.  Exam reveals no gallop and no friction rub.   Murmur heard.  Systolic murmur is present with a grade of 2/6  throughout  Pulmonary/Chest: Effort normal and breath sounds normal. No respiratory distress. She has no wheezes. She has no rales.  Abdominal: Soft. Bowel sounds are normal. She exhibits no distension and no mass. There is no tenderness.  Musculoskeletal: She exhibits no edema.  Lymphadenopathy:    She has no cervical adenopathy.  Neurological: She is alert and oriented to person, place, and time.  Vitals reviewed.      Recent Results (from the past 2160 hour(s))  CBC with Differential/Platelet     Status: None   Collection Time: 03/12/15 10:41 AM  Result Value Ref Range   WBC 6.5 3.6 - 11.0 K/uL   RBC 4.67 3.80 - 5.20 MIL/uL   Hemoglobin 14.2 12.0 - 16.0 g/dL   HCT 42.2 35.0 - 47.0 %   MCV 90.4 80.0 - 100.0 fL   MCH 30.3 26.0 - 34.0 pg   MCHC 33.5 32.0 - 36.0 g/dL   RDW 14.3 11.5 - 14.5 %   Platelets 239 150 - 440 K/uL   Neutrophils Relative % 70 %   Neutro Abs 4.6 1.4 - 6.5 K/uL   Lymphocytes Relative 23 %   Lymphs Abs 1.5 1.0 - 3.6 K/uL   Monocytes Relative 5 %   Monocytes Absolute 0.3 0.2 - 0.9 K/uL   Eosinophils Relative 1 %   Eosinophils Absolute 0.0 0 - 0.7 K/uL   Basophils Relative 1 %   Basophils Absolute 0.0 0 - 0.1 K/uL  Comprehensive metabolic panel     Status: Abnormal   Collection Time: 03/12/15 10:41 AM  Result Value Ref Range   Sodium 143 135 - 145 mmol/L   Potassium 3.0 (L) 3.5 - 5.1 mmol/L   Chloride 101 101 - 111 mmol/L   CO2 31 22 - 32 mmol/L   Glucose, Bld 163 (H) 65 - 99 mg/dL    BUN 15 6 - 20 mg/dL   Creatinine, Ser 0.90 0.44 - 1.00 mg/dL   Calcium 9.4 8.9 - 10.3 mg/dL   Total Protein 7.2 6.5 - 8.1 g/dL   Albumin 4.3 3.5 - 5.0 g/dL   AST 27 15 - 41  U/L   ALT 18 14 - 54 U/L   Alkaline Phosphatase 100 38 - 126 U/L   Total Bilirubin 0.3 0.3 - 1.2 mg/dL   GFR calc non Af Amer 54 (L) >60 mL/min   GFR calc Af Amer >60 >60 mL/min    Comment: (NOTE) The eGFR has been calculated using the CKD EPI equation. This calculation has not been validated in all clinical situations. eGFR's persistently <60 mL/min signify possible Chronic Kidney Disease.    Anion gap 11 5 - 15  Comprehensive metabolic panel     Status: Abnormal   Collection Time: 05/19/15  8:40 PM  Result Value Ref Range   Sodium 144 135 - 145 mmol/L   Potassium 3.2 (L) 3.5 - 5.1 mmol/L   Chloride 96 (L) 101 - 111 mmol/L   CO2 36 (H) 22 - 32 mmol/L   Glucose, Bld 190 (H) 65 - 99 mg/dL   BUN 15 6 - 20 mg/dL   Creatinine, Ser 0.83 0.44 - 1.00 mg/dL   Calcium 10.0 8.9 - 10.3 mg/dL   Total Protein 7.8 6.5 - 8.1 g/dL   Albumin 5.0 3.5 - 5.0 g/dL   AST 27 15 - 41 U/L   ALT 19 14 - 54 U/L   Alkaline Phosphatase 97 38 - 126 U/L   Total Bilirubin 0.8 0.3 - 1.2 mg/dL   GFR calc non Af Amer 59 (L) >60 mL/min   GFR calc Af Amer >60 >60 mL/min    Comment: (NOTE) The eGFR has been calculated using the CKD EPI equation. This calculation has not been validated in all clinical situations. eGFR's persistently <60 mL/min signify possible Chronic Kidney Disease.    Anion gap 12 5 - 15  Lipase, blood     Status: Abnormal   Collection Time: 05/19/15  8:40 PM  Result Value Ref Range   Lipase 61 (H) 11 - 51 U/L  CBC with Differential     Status: Abnormal   Collection Time: 05/19/15  8:40 PM  Result Value Ref Range   WBC 12.5 (H) 3.6 - 11.0 K/uL   RBC 4.99 3.80 - 5.20 MIL/uL   Hemoglobin 14.4 12.0 - 16.0 g/dL   HCT 44.2 35.0 - 47.0 %   MCV 88.5 80.0 - 100.0 fL   MCH 28.8 26.0 - 34.0 pg   MCHC 32.6 32.0 - 36.0  g/dL   RDW 14.1 11.5 - 14.5 %   Platelets 304 150 - 440 K/uL   Neutrophils Relative % 89 %   Neutro Abs 11.1 (H) 1.4 - 6.5 K/uL   Lymphocytes Relative 6 %   Lymphs Abs 0.8 (L) 1.0 - 3.6 K/uL   Monocytes Relative 5 %   Monocytes Absolute 0.6 0.2 - 0.9 K/uL   Eosinophils Relative 0 %   Eosinophils Absolute 0.0 0 - 0.7 K/uL   Basophils Relative 0 %   Basophils Absolute 0.0 0 - 0.1 K/uL  Protime-INR     Status: None   Collection Time: 05/19/15  8:40 PM  Result Value Ref Range   Prothrombin Time 13.5 11.4 - 15.0 seconds   INR 1.01   Type and screen [facility]     Status: None   Collection Time: 05/19/15  8:55 PM  Result Value Ref Range   ABO/RH(D) O POS    Antibody Screen NEG    Sample Expiration 05/22/2015   ABO/Rh     Status: None   Collection Time: 05/19/15  8:56 PM  Result Value Ref Range   ABO/RH(D) O POS   Hemoglobin and hematocrit, blood     Status: None   Collection Time: 05/20/15 12:35 AM  Result Value Ref Range   Hemoglobin 12.2 12.0 - 16.0 g/dL   HCT 37.6 35.0 - 47.0 %  Hemoglobin and hematocrit, blood     Status: None   Collection Time: 05/20/15  5:51 AM  Result Value Ref Range   Hemoglobin 12.1 12.0 - 16.0 g/dL   HCT 36.3 35.0 - 36.6 %  Basic metabolic panel     Status: Abnormal   Collection Time: 05/20/15  5:51 AM  Result Value Ref Range   Sodium 144 135 - 145 mmol/L   Potassium 3.4 (L) 3.5 - 5.1 mmol/L   Chloride 107 101 - 111 mmol/L   CO2 29 22 - 32 mmol/L   Glucose, Bld 164 (H) 65 - 99 mg/dL   BUN 22 (H) 6 - 20 mg/dL   Creatinine, Ser 0.57 0.44 - 1.00 mg/dL   Calcium 8.6 (L) 8.9 - 10.3 mg/dL   GFR calc non Af Amer >60 >60 mL/min   GFR calc Af Amer >60 >60 mL/min    Comment: (NOTE) The eGFR has been calculated using the CKD EPI equation. This calculation has not been validated in all clinical situations. eGFR's persistently <60 mL/min signify possible Chronic Kidney Disease.    Anion gap 8 5 - 15  Hemoglobin and hematocrit,  blood     Status: Abnormal   Collection Time: 05/20/15 11:58 AM  Result Value Ref Range   Hemoglobin 11.5 (L) 12.0 - 16.0 g/dL   HCT 34.3 (L) 35.0 - 47.0 %  Magnesium     Status: Abnormal   Collection Time: 05/20/15 11:58 AM  Result Value Ref Range   Magnesium 1.6 (L) 1.7 - 2.4 mg/dL  Basic metabolic panel     Status: Abnormal   Collection Time: 05/21/15  4:59 AM  Result Value Ref Range   Sodium 144 135 - 145 mmol/L   Potassium 2.8 (LL) 3.5 - 5.1 mmol/L    Comment: CRITICAL RESULT CALLED TO, READ BACK BY AND VERIFIED WITH Adonis Huguenin BARRERA AT [phone] ON 05/21/15.Marland KitchenMarland KitchenCesc LLC    Chloride 110 101 - 111 mmol/L   CO2 27 22 - 32 mmol/L   Glucose, Bld 111 (H) 65 - 99 mg/dL   BUN 10 6 - 20 mg/dL   Creatinine, Ser 0.51 0.44 - 1.00 mg/dL   Calcium 8.4 (L) 8.9 - 10.3 mg/dL   GFR calc non Af Amer >60 >60 mL/min   GFR calc Af Amer >60 >60 mL/min    Comment: (NOTE) The eGFR has been calculated using the CKD EPI equation. This calculation has not been validated in all clinical situations. eGFR's persistently <60 mL/min signify possible Chronic Kidney Disease.    Anion gap 7 5 - 15  CBC     Status: None   Collection Time: 05/21/15  4:59 AM  Result Value Ref Range   WBC 5.4 3.6 - 11.0 K/uL   RBC 4.06 3.80 - 5.20 MIL/uL   Hemoglobin 12.3 12.0 - 16.0 g/dL   HCT 36.9 35.0 - 47.0 %   MCV 91.0 80.0 - 100.0 fL   MCH 30.4 26.0 - 34.0 pg   MCHC 33.4 32.0 - 36.0 g/dL   RDW 14.4 11.5 - 14.5 %   Platelets 230 150 - 440 K/uL  Magnesium     Status: None   Collection Time: 05/21/15  4:59 AM  Result Value Ref Range   Magnesium 2.4 1.7 - 2.4 mg/dL  Basic metabolic panel     Status: Abnormal   Collection Time: 05/21/15 11:41 AM  Result Value Ref Range   Sodium 142 135 - 145 mmol/L   Potassium 4.0 3.5 - 5.1 mmol/L   Chloride 111 101 - 111 mmol/L   CO2 24 22 - 32 mmol/L   Glucose, Bld 109 (H) 65 - 99 mg/dL   BUN 9 6 - 20 mg/dL   Creatinine, Ser 0.48 0.44 - 1.00 mg/dL   Calcium 8.6 (L) 8.9 - 10.3 mg/dL    GFR calc non Af Amer >60 >60 mL/min   GFR calc Af Amer >60 >60 mL/min    Comment: (NOTE) The eGFR has been calculated using the CKD EPI equation. This calculation has not been validated in all clinical situations. eGFR's persistently <60 mL/min signify possible Chronic Kidney Disease.    Anion gap 7 5 - 15     Assessment & Plan  Problem List Items Addressed This Visit      Cardiovascular and Mediastinum   Benign essential HTN   Relevant Medications   carvedilol (COREG) 3.125 MG tablet     Digestive   GI bleeding - Primary   Relevant Medications   sucralfate (CARAFATE) 1 GM/10ML suspension   Other Relevant Orders   CBC with Differential      Meds ordered this encounter  Medications  . levothyroxine (SYNTHROID, LEVOTHROID) 75 MCG tablet    Sig: Take 75 mcg by mouth daily.    Refill:  11  . Probiotic Product (DIGESTIVE ADVANTAGE PO)    Sig: Take 1 tablet by mouth daily.  . sucralfate (CARAFATE) 1 GM/10ML suspension    Sig: Take 10 mLs (1 g total) by mouth 4 (four) times daily.    Dispense:  120 mL    Refill:  6  . carvedilol (COREG) 3.125 MG tablet    Sig: Take 1 tablet (3.125 mg total) by mouth 2 (two) times daily with a meal.    Dispense:  60 tablet    Refill:  12   1. Gastrointestinal hemorrhage, unspecified gastritis, unspecified gastrointestinal hemorrhage type - CBC with Differential - sucralfate (CARAFATE) 1 GM/10ML suspension; Take 10 mLs (1 g total) by mouth 4 (four) times daily.  Dispense: 120 mL; Refill: 6  2. Benign essential HTN - carvedilol (COREG) 3.125 MG tablet; Take 1 tablet (3.125 mg total) by mouth 2 (two) times daily with a meal.  Dispense: 60 tablet; Refill: 12

## 2015-05-31 ENCOUNTER — Inpatient Hospital Stay: Payer: Medicare Other | Admitting: Family Medicine

## 2015-06-01 LAB — CBC WITH DIFFERENTIAL/PLATELET
BASOS ABS: 0 10*3/uL (ref 0.0–0.2)
BASOS: 0 %
EOS (ABSOLUTE): 0.1 10*3/uL (ref 0.0–0.4)
EOS: 1 %
Hematocrit: 38.5 % (ref 34.0–46.6)
Hemoglobin: 12.6 g/dL (ref 11.1–15.9)
Immature Grans (Abs): 0 10*3/uL (ref 0.0–0.1)
Immature Granulocytes: 0 %
LYMPHS: 28 %
Lymphocytes Absolute: 1.8 10*3/uL (ref 0.7–3.1)
MCH: 29.6 pg (ref 26.6–33.0)
MCHC: 32.7 g/dL (ref 31.5–35.7)
MCV: 91 fL (ref 79–97)
MONOS ABS: 0.3 10*3/uL (ref 0.1–0.9)
Monocytes: 5 %
NEUTROS PCT: 66 %
Neutrophils Absolute: 4.2 10*3/uL (ref 1.4–7.0)
PLATELETS: 268 10*3/uL (ref 150–379)
RBC: 4.25 x10E6/uL (ref 3.77–5.28)
RDW: 14.8 % (ref 12.3–15.4)
WBC: 6.4 10*3/uL (ref 3.4–10.8)

## 2015-06-07 ENCOUNTER — Telehealth: Payer: Self-pay | Admitting: Family Medicine

## 2015-06-07 NOTE — Telephone Encounter (Signed)
Is she taking tablets or liquid?-jh

## 2015-06-07 NOTE — Telephone Encounter (Signed)
Change her to Carafate tablets.  Dissolve 1 in 1-2 oz of water and drink 4 times a day. #120/6 refills.

## 2015-06-07 NOTE — Telephone Encounter (Signed)
Please suggest? 

## 2015-06-07 NOTE — Telephone Encounter (Signed)
Spoke to pt she is taking liquid.

## 2015-06-07 NOTE — Telephone Encounter (Signed)
Manuela Schwartz, pt's daughter said the small bottle of carafate is not lasting a week.  She asked to get a prescription for a larger bottle.  The call back number is [phone]

## 2015-06-08 ENCOUNTER — Other Ambulatory Visit: Payer: Self-pay

## 2015-06-08 DIAGNOSIS — K633 Ulcer of intestine: Secondary | ICD-10-CM

## 2015-06-08 MED ORDER — SUCRALFATE 1 G PO TABS: 1.0000 g | ORAL_TABLET | Freq: Three times a day (TID) | ORAL | Status: DC

## 2015-06-08 NOTE — Telephone Encounter (Signed)
Done

## 2015-06-22 ENCOUNTER — Other Ambulatory Visit: Payer: Self-pay | Admitting: Family Medicine

## 2015-07-26 ENCOUNTER — Ambulatory Visit (INDEPENDENT_AMBULATORY_CARE_PROVIDER_SITE_OTHER): Payer: Medicare Other | Admitting: Family Medicine

## 2015-07-26 ENCOUNTER — Encounter: Payer: Self-pay | Admitting: Family Medicine

## 2015-07-26 VITALS — BP 150/82 | HR 72 | Resp 16 | Ht <= 58 in | Wt 104.0 lb

## 2015-07-26 DIAGNOSIS — I10 Essential (primary) hypertension: Secondary | ICD-10-CM | POA: Diagnosis not present

## 2015-07-26 DIAGNOSIS — J301 Allergic rhinitis due to pollen: Secondary | ICD-10-CM | POA: Diagnosis not present

## 2015-07-26 DIAGNOSIS — E876 Hypokalemia: Secondary | ICD-10-CM

## 2015-07-26 DIAGNOSIS — R600 Localized edema: Secondary | ICD-10-CM | POA: Diagnosis not present

## 2015-07-26 NOTE — Progress Notes (Signed)
Subjective:    Patient ID: Krista Randall, female    DOB: December 03, 1920, 82 y.o.   MRN: [id number]  HPI: Krista Randall is a 82 y.o. female presenting on 07/26/2015 for Hypertension   HPI Pt presents for BP follow-up.  BP is elevated today. Does not check at home. No dizziness. No chest pain. Baseline shortness of breath- no acute change. She is having some HA. Some swelling in her legs. This is a chronic issues. Taking fosinopril (once daily in the AM) and coreg (BID), taking HCTZ 12.5mg  for leg swelling in the AM. Daughter checks Bp at home- running 150-170/80-90 at home.  HA: HA occur in AM. HA have gone away on their own. HA are occurring 2-3 times per week. HA occur just behind the eyes and sinues. She believes it is related to sinuses/ allergies. Taking flonase daily.   Past Medical History  Diagnosis Date  . Breast cancer ([place]) 2009    left breast  . Breast mass march 2016    rt lump @ [place]   . Hernia, hiatal     Current Outpatient Prescriptions on File Prior to Visit  Medication Sig  . carvedilol (COREG) 3.125 MG tablet Take 1 tablet (3.125 mg total) by mouth 2 (two) times daily with a meal.  . ferrous sulfate 325 (65 FE) MG tablet Take 325 mg by mouth daily with breakfast.  . fluticasone (FLONASE) 50 MCG/ACT nasal spray INSTILL 1 OR 2 SPRAYS IN EACH NOSTRIL DAILY.  . fosinopril (MONOPRIL) 40 MG tablet Take 40 mg by mouth daily.  . hydrochlorothiazide (MICROZIDE) 12.5 MG capsule Take 12.5 mg by mouth daily.  Marland Kitchen latanoprost (XALATAN) 0.005 % ophthalmic solution Place 1 drop into both eyes at bedtime.   Marland Kitchen levothyroxine (SYNTHROID, LEVOTHROID) 50 MCG tablet Take 50 mcg by mouth daily before breakfast.  . levothyroxine (SYNTHROID, LEVOTHROID) 75 MCG tablet Take 75 mcg by mouth daily.  . pantoprazole (PROTONIX) 40 MG tablet Take 40 mg by mouth daily.  . potassium chloride (K-DUR) 10 MEQ tablet Take 1 tablet (10 mEq total) by mouth daily.  . Probiotic Product (DIGESTIVE  ADVANTAGE PO) Take 1 tablet by mouth daily.   No current facility-administered medications on file prior to visit.    Review of Systems  Constitutional: Negative for fever and chills.  HENT: Negative.   Respiratory: Negative for cough, chest tightness and wheezing.   Cardiovascular: Negative for chest pain and leg swelling.  Gastrointestinal: Negative for nausea, vomiting, abdominal pain, diarrhea and constipation.  Endocrine: Negative.  Negative for cold intolerance, heat intolerance, polydipsia, polyphagia and polyuria.  Genitourinary: Negative for dysuria and difficulty urinating.  Musculoskeletal: Negative.   Neurological: Negative for dizziness, light-headedness and numbness.  Psychiatric/Behavioral: Negative.    Per HPI unless specifically indicated above     Objective:    BP 150/82 mmHg  Pulse 72  Resp 16  Ht 4\' 8"  (1.422 m)  Wt 104 lb (47.174 kg)  BMI 23.33 kg/m2  SpO2 99%  Wt Readings from Last 3 Encounters:  07/26/15 104 lb (47.174 kg)  05/24/15 98 lb (44.453 kg)  05/20/15 101 lb 14.6 oz (46.227 kg)    Physical Exam  Constitutional: She is oriented to person, place, and time. She appears well-developed and well-nourished.  HENT:  Head: Normocephalic and atraumatic.  Neck: Neck supple.  Cardiovascular: Normal rate, regular rhythm and normal heart sounds.  Exam reveals no gallop and no friction rub.   No murmur heard. Pulmonary/Chest: Effort  normal and breath sounds normal. She has no wheezes. She exhibits no tenderness.  Abdominal: Soft. Normal appearance and bowel sounds are normal. She exhibits no distension and no mass. There is no tenderness. There is no rebound and no guarding.  Musculoskeletal: Normal range of motion. She exhibits no edema or tenderness.  +1 pitting edema bilateral ankles.   Lymphadenopathy:    She has no cervical adenopathy.  Neurological: She is alert and oriented to person, place, and time.  Skin: Skin is warm and dry.   Results for  orders placed or performed in visit on 05/24/15  CBC with Differential  Result Value Ref Range   WBC 6.4 3.4 - 10.8 x10E3/uL   RBC 4.25 3.77 - 5.28 x10E6/uL   Hemoglobin 12.6 11.1 - 15.9 g/dL   Hematocrit 38.5 34.0 - 46.6 %   MCV 91 79 - 97 fL   MCH 29.6 26.6 - 33.0 pg   MCHC 32.7 31.5 - 35.7 g/dL   RDW 14.8 12.3 - 15.4 %   Platelets 268 150 - 379 x10E3/uL   Neutrophils 66 %   Lymphs 28 %   Monocytes 5 %   Eos 1 %   Basos 0 %   Neutrophils Absolute 4.2 1.4 - 7.0 x10E3/uL   Lymphocytes Absolute 1.8 0.7 - 3.1 x10E3/uL   Monocytes Absolute 0.3 0.1 - 0.9 x10E3/uL   EOS (ABSOLUTE) 0.1 0.0 - 0.4 x10E3/uL   Basophils Absolute 0.0 0.0 - 0.2 x10E3/uL   Immature Granulocytes 0 %   Immature Grans (Abs) 0.0 0.0 - 0.1 x10E3/uL      Assessment & Plan:   Problem List Items Addressed This Visit      Cardiovascular and Mediastinum   Benign essential HTN - Primary    BP 150/82 today- controlled for patient age. No medication changes today. Continue to check BP at home. Recheck 1 mos since home Bp's are high.  Recheck BMET.         Respiratory   Allergic rhinitis due to pollen    HA likely 2/2 allergy and sinus symptoms. Encouraged daily flonase. Tylenol for HA PRN. Return if symptoms are not improving.         Other   Pedal edema    Continue HCTZ for swelling. Encouraged elevating extremities when sitting. Compression socks PRN. Drink plenty of fluids. Reviewed swelling alarm symptoms.        Other Visit Diagnoses    Hypokalemia        Critically low in Jan. On repletion. Will recheck today to ensure levels are stable.     Relevant Orders    Basic Metabolic Panel (BMET)       Meds ordered this encounter  Medications  . CARAFATE 1 GM/10ML suspension    Sig:     Refill:  5      Follow up plan: Return in about 4 weeks (around 08/23/2015) for BP check. Marland Kitchen

## 2015-07-26 NOTE — Patient Instructions (Signed)
Have your daughter keep an eye on your blood pressure at home. Goal is 150/90 or less for you. Please call us with elevated BP's > 150/90 consistently. We may need to adjust your blood pressure medications.  Your goal blood pressure is 150/90. Work on low salt/sodium diet - goal <2.5gm (1,500mg ) per day. Eat a diet high in fruits/vegetables and whole grains.  Look into mediterranean and DASH diet. Goal activity is 163min/wk of moderate intensity exercise.  This can be split into 30 minute chunks.  If you are not at this level, you can start with smaller 10-15 min increments and slowly build up activity. Look at [email or web address] for more resources  Leg swelling: Elevate your feet when you sit down to help with swelling. Drink plenty of water. You may want to get some compression socks to help with swelling.  Please let Dr. Luan Pulling' know if you wake up in the morning with swollen legs.   Please get your potassium rechecked.

## 2015-07-26 NOTE — Assessment & Plan Note (Signed)
BP 150/82 today- controlled for patient age. No medication changes today. Continue to check BP at home. Recheck 1 mos since home Bp's are high.  Recheck BMET.

## 2015-07-26 NOTE — Assessment & Plan Note (Signed)
HA likely 2/2 allergy and sinus symptoms. Encouraged daily flonase. Tylenol for HA PRN. Return if symptoms are not improving.

## 2015-07-26 NOTE — Assessment & Plan Note (Signed)
Continue HCTZ for swelling. Encouraged elevating extremities when sitting. Compression socks PRN. Drink plenty of fluids. Reviewed swelling alarm symptoms.

## 2015-08-21 LAB — BASIC METABOLIC PANEL
BUN/Creatinine Ratio: 18 (ref 12–28)
BUN: 15 mg/dL (ref 10–36)
CALCIUM: 9.5 mg/dL (ref 8.7–10.3)
CO2: 23 mmol/L (ref 18–29)
CREATININE: 0.84 mg/dL (ref 0.57–1.00)
Chloride: 102 mmol/L (ref 96–106)
GFR calc Af Amer: 69 mL/min/{1.73_m2} (ref >59)
GFR, EST NON AFRICAN AMERICAN: 60 mL/min/{1.73_m2} (ref >59)
GLUCOSE: 151 mg/dL — AB (ref 65–99)
Potassium: 4 mmol/L (ref 3.5–5.2)
SODIUM: 145 mmol/L — AB (ref 134–144)

## 2015-08-24 ENCOUNTER — Encounter: Payer: Self-pay | Admitting: Family Medicine

## 2015-08-24 ENCOUNTER — Ambulatory Visit (INDEPENDENT_AMBULATORY_CARE_PROVIDER_SITE_OTHER): Payer: Medicare Other | Admitting: Family Medicine

## 2015-08-24 ENCOUNTER — Telehealth: Payer: Self-pay | Admitting: *Deleted

## 2015-08-24 ENCOUNTER — Other Ambulatory Visit: Payer: Self-pay | Admitting: *Deleted

## 2015-08-24 VITALS — BP 200/80 | HR 68 | Resp 16 | Ht <= 58 in | Wt 104.0 lb

## 2015-08-24 DIAGNOSIS — I10 Essential (primary) hypertension: Secondary | ICD-10-CM

## 2015-08-24 DIAGNOSIS — N393 Stress incontinence (female) (male): Secondary | ICD-10-CM

## 2015-08-24 DIAGNOSIS — R350 Frequency of micturition: Secondary | ICD-10-CM | POA: Diagnosis not present

## 2015-08-24 DIAGNOSIS — R32 Unspecified urinary incontinence: Secondary | ICD-10-CM

## 2015-08-24 DIAGNOSIS — R7303 Prediabetes: Secondary | ICD-10-CM | POA: Diagnosis not present

## 2015-08-24 DIAGNOSIS — K254 Chronic or unspecified gastric ulcer with hemorrhage: Secondary | ICD-10-CM

## 2015-08-24 DIAGNOSIS — T50905A Adverse effect of unspecified drugs, medicaments and biological substances, initial encounter: Secondary | ICD-10-CM

## 2015-08-24 DIAGNOSIS — N39498 Other specified urinary incontinence: Secondary | ICD-10-CM

## 2015-08-24 LAB — POCT URINALYSIS DIPSTICK
BILIRUBIN UA: NEGATIVE
Glucose, UA: NEGATIVE
KETONES UA: NEGATIVE
LEUKOCYTES UA: NEGATIVE
NITRITE UA: NEGATIVE
PH UA: 6.5
PROTEIN UA: 30
RBC UA: NEGATIVE
Spec Grav, UA: 1.01
Urobilinogen, UA: 0.2

## 2015-08-24 LAB — POCT GLYCOSYLATED HEMOGLOBIN (HGB A1C): Hemoglobin A1C: 6.1

## 2015-08-24 MED ORDER — TOLTERODINE TARTRATE ER 2 MG PO CP24: 2.0000 mg | ORAL_CAPSULE | Freq: Every day | ORAL | Status: AC

## 2015-08-24 MED ORDER — CLONIDINE HCL 0.1 MG PO TABS: 0.1000 mg | ORAL_TABLET | Freq: Two times a day (BID) | ORAL | Status: DC

## 2015-08-24 NOTE — Telephone Encounter (Signed)
I will recheck BMP early next week.  Put order in and let patient know to have it done Monday next week.-jh

## 2015-08-24 NOTE — Telephone Encounter (Signed)
Called patient and informed that she will need labs drawn next week.[place]

## 2015-08-24 NOTE — Progress Notes (Signed)
Name: Krista Randall   MRN: [id number]    DOB: 1921-02-12   Date:08/24/2015       Progress Note  Subjective  Chief Complaint  Chief Complaint  Patient presents with  . Hypertension  . Urinary Incontinence    HPI Here for f/u of HBP.  States that her sugar has been running higher than it should recently when she checks it.  Also c/o urinary incontinence (urge and sdress).  No problem-specific assessment & plan notes found for this encounter.   Past Medical History  Diagnosis Date  . Breast cancer ([place]) 2009    left breast  . Breast mass march 2016    rt lump @ [place]   . Hernia, hiatal     Past Surgical History  Procedure Laterality Date  . Mastectomy Left 2009  . Breast biopsy Right 1980's    negative  . Cholecystectomy      Family History  Problem Relation Age of Onset  . Colon cancer Mother 32  . Cancer Brother 45    lung  . Diabetes Mellitus I Daughter     Social History   Social History  . Marital Status: Widowed    Spouse Name: N/A  . Number of Children: N/A  . Years of Education: N/A   Occupational History  . retired    Social History Main Topics  . Smoking status: Never Smoker   . Smokeless tobacco: Never Used  . Alcohol Use: No  . Drug Use: No  . Sexual Activity: Not on file   Other Topics Concern  . Not on file   Social History Narrative   Lives alone     Current outpatient prescriptions:  .  carvedilol (COREG) 3.125 MG tablet, Take 1 tablet (3.125 mg total) by mouth 2 (two) times daily with a meal., Disp: 60 tablet, Rfl: 12 .  ferrous sulfate 325 (65 FE) MG tablet, Take 325 mg by mouth daily with breakfast., Disp: , Rfl:  .  fluticasone (FLONASE) 50 MCG/ACT nasal spray, INSTILL 1 OR 2 SPRAYS IN EACH NOSTRIL DAILY., Disp: 16 g, Rfl: 12 .  fosinopril (MONOPRIL) 40 MG tablet, Take 40 mg by mouth daily., Disp: , Rfl:  .  hydrochlorothiazide (MICROZIDE) 12.5 MG capsule, Take 12.5 mg by mouth daily., Disp: , Rfl:  .  levothyroxine  (SYNTHROID, LEVOTHROID) 75 MCG tablet, Take 75 mcg by mouth daily., Disp: , Rfl: 11 .  ofloxacin (OCUFLOX) 0.3 % ophthalmic solution, , Disp: , Rfl: 2 .  pantoprazole (PROTONIX) 40 MG tablet, Take 40 mg by mouth daily., Disp: , Rfl:  .  potassium chloride (K-DUR) 10 MEQ tablet, Take 1 tablet (10 mEq total) by mouth daily., Disp: 30 tablet, Rfl: 0 .  Probiotic Product (DIGESTIVE ADVANTAGE PO), Take 1 tablet by mouth daily., Disp: , Rfl:  .  sucralfate (CARAFATE) 1 g tablet, , Disp: , Rfl: 5 .  cloNIDine (CATAPRES) 0.1 MG tablet, Take 1 tablet (0.1 mg total) by mouth 2 (two) times daily., Disp: 60 tablet, Rfl: 6 .  latanoprost (XALATAN) 0.005 % ophthalmic solution, , Disp: , Rfl: 4 .  tolterodine (DETROL LA) 2 MG 24 hr capsule, Take 1 capsule (2 mg total) by mouth daily., Disp: 30 capsule, Rfl: 6  Not on File   Review of Systems  Constitutional: Negative for fever, chills, weight loss and malaise/fatigue.  HENT: Negative for hearing loss.   Eyes: Negative for blurred vision and double vision.  Respiratory: Negative for cough, shortness  of breath and wheezing.   Cardiovascular: Negative for chest pain, palpitations and leg swelling.  Gastrointestinal: Negative for heartburn, abdominal pain and blood in stool.  Genitourinary: Positive for urgency and frequency. Negative for dysuria.  Skin: Negative for rash.  Neurological: Positive for tremors. Negative for weakness and headaches.      Objective  Filed Vitals:   08/24/15 0915 08/24/15 1010  BP: 183/69 200/80  Pulse: 68 68  Resp: 16   Height: 4\' 8"  (1.422 m)   Weight: 104 lb (47.174 kg)     Physical Exam  Constitutional: She is oriented to person, place, and time and well-developed, well-nourished, and in no distress. No distress.  HENT:  Head: Normocephalic and atraumatic.  Eyes: Conjunctivae and EOM are normal. Pupils are equal, round, and reactive to light. No scleral icterus.  Neck: Normal range of motion. Neck supple.  Carotid bruit is not present. No thyromegaly present.  Cardiovascular: Normal rate and regular rhythm.   Occasional extrasystoles are present.  Murmur heard.  Systolic murmur is present with a grade of 2/6  LLSB and apex  Pulmonary/Chest: Effort normal and breath sounds normal. No respiratory distress. She has no wheezes. She has no rales.  Musculoskeletal: She exhibits edema (trace bilateral pedal edema.).  Lymphadenopathy:    She has no cervical adenopathy.  Neurological: She is alert and oriented to person, place, and time.  Vitals reviewed.      Recent Results (from the past 2160 hour(s))  CBC with Differential     Status: None   Collection Time: 05/31/15  2:15 PM  Result Value Ref Range   WBC 6.4 3.4 - 10.8 x10E3/uL   RBC 4.25 3.77 - 5.28 x10E6/uL   Hemoglobin 12.6 11.1 - 15.9 g/dL   Hematocrit 38.5 34.0 - 46.6 %   MCV 91 79 - 97 fL   MCH 29.6 26.6 - 33.0 pg   MCHC 32.7 31.5 - 35.7 g/dL   RDW 14.8 12.3 - 15.4 %   Platelets 268 150 - 379 x10E3/uL   Neutrophils 66 %   Lymphs 28 %   Monocytes 5 %   Eos 1 %   Basos 0 %   Neutrophils Absolute 4.2 1.4 - 7.0 x10E3/uL   Lymphocytes Absolute 1.8 0.7 - 3.1 x10E3/uL   Monocytes Absolute 0.3 0.1 - 0.9 x10E3/uL   EOS (ABSOLUTE) 0.1 0.0 - 0.4 x10E3/uL   Basophils Absolute 0.0 0.0 - 0.2 x10E3/uL   Immature Granulocytes 0 %   Immature Grans (Abs) 0.0 0.0 - 0.1 [id number]  Basic Metabolic Panel (BMET)     Status: Abnormal   Collection Time: 08/20/15 11:20 AM  Result Value Ref Range   Glucose 151 (H) 65 - 99 mg/dL   BUN 15 10 - 36 mg/dL   Creatinine, Ser 0.84 0.57 - 1.00 mg/dL   GFR calc non Af Amer 60 >59 mL/min/1.73   GFR calc Af Amer 69 >59 mL/min/1.73   BUN/Creatinine Ratio 18 12 - 28   Sodium 145 (H) 134 - 144 mmol/L   Potassium 4.0 3.5 - 5.2 mmol/L   Chloride 102 96 - 106 mmol/L   CO2 23 18 - 29 mmol/L   Calcium 9.5 8.7 - 10.3 mg/dL  POCT HgB A1C     Status: Normal   Collection Time: 08/24/15 10:03 AM  Result Value Ref  Range   Hemoglobin A1C 6.1   POCT urinalysis dipstick     Status: Abnormal   Collection Time: 08/24/15 10:04 AM  Result Value  Ref Range   Color, UA yellow    Clarity, UA clear    Glucose, UA neg    Bilirubin, UA neg    Ketones, UA neg    Spec Grav, UA 1.010    Blood, UA neg    pH, UA 6.5    Protein, UA 30    Urobilinogen, UA 0.2    Nitrite, UA neg    Leukocytes, UA Negative Negative     Assessment & Plan  Problem List Items Addressed This Visit      Cardiovascular and Mediastinum   Benign essential HTN   Relevant Medications   cloNIDine (CATAPRES) 0.1 MG tablet     Digestive   GI bleeding     Other   Pre-diabetes   Incontinence in female   Relevant Medications   tolterodine (DETROL LA) 2 MG 24 hr capsule    Other Visit Diagnoses    Urine frequency    -  Primary    Relevant Medications    tolterodine (DETROL LA) 2 MG 24 hr capsule    Other Relevant Orders    POCT urinalysis dipstick (Completed)    Prediabetes        Relevant Orders    POCT HgB A1C (Completed)       Meds ordered this encounter  Medications  . ofloxacin (OCUFLOX) 0.3 % ophthalmic solution    Sig:     Refill:  2  . sucralfate (CARAFATE) 1 g tablet    Sig:     Refill:  5  . latanoprost (XALATAN) 0.005 % ophthalmic solution    Sig:     Refill:  4  . tolterodine (DETROL LA) 2 MG 24 hr capsule    Sig: Take 1 capsule (2 mg total) by mouth daily.    Dispense:  30 capsule    Refill:  6  . cloNIDine (CATAPRES) 0.1 MG tablet    Sig: Take 1 tablet (0.1 mg total) by mouth 2 (two) times daily.    Dispense:  60 tablet    Refill:  6   1. Urine frequency  - POCT urinalysis dipstick-wnl except for 3+ protein. - tolterodine (DETROL LA) 2 MG 24 hr capsule; Take 1 capsule (2 mg total) by mouth daily.  Dispense: 30 capsule; Refill: 6  2. Prediabetes  - POCT HgB A1C-6.1  3. Benign essential HTN Cont. Carvedilol, Monopril, HCTZ, K+ Cl-,-  cloNIDine (CATAPRES) 0.1 MG tablet; Take 1 tablet (0.1 mg  total) by mouth 2 (two) times daily.  Dispense: 60 tablet; Refill: 6  4. Gastrointestinal hemorrhage associated with gastric ulcer  Cont Pantropazole    6. Incontinence in female  - tolterodine (DETROL LA) 2 MG 24 hr capsule; Take 1 capsule (2 mg total) by mouth daily.  Dispense: 30 capsule; Refill: 6

## 2015-08-24 NOTE — Telephone Encounter (Signed)
There is a interaction between Tolterodine and Potassium. Pharmacy called to see if you wanted to change.

## 2015-08-31 LAB — BASIC METABOLIC PANEL
BUN/Creatinine Ratio: 18 (ref 12–28)
BUN: 14 mg/dL (ref 10–36)
CALCIUM: 9.4 mg/dL (ref 8.7–10.3)
CHLORIDE: 103 mmol/L (ref 96–106)
CO2: 25 mmol/L (ref 18–29)
CREATININE: 0.79 mg/dL (ref 0.57–1.00)
GFR calc Af Amer: 75 mL/min/{1.73_m2} (ref >59)
GFR, EST NON AFRICAN AMERICAN: 65 mL/min/{1.73_m2} (ref >59)
GLUCOSE: 111 mg/dL — AB (ref 65–99)
POTASSIUM: 3.6 mmol/L (ref 3.5–5.2)
SODIUM: 145 mmol/L — AB (ref 134–144)

## 2015-09-06 ENCOUNTER — Ambulatory Visit: Payer: Medicare Other | Admitting: Family Medicine

## 2015-09-07 ENCOUNTER — Ambulatory Visit (INDEPENDENT_AMBULATORY_CARE_PROVIDER_SITE_OTHER): Payer: Medicare Other | Admitting: Family Medicine

## 2015-09-07 ENCOUNTER — Encounter: Payer: Self-pay | Admitting: Family Medicine

## 2015-09-07 ENCOUNTER — Ambulatory Visit: Payer: Medicare Other

## 2015-09-07 VITALS — BP 200/90 | HR 61 | Temp 98.2°F | Resp 16 | Ht <= 58 in | Wt 103.0 lb

## 2015-09-07 DIAGNOSIS — I10 Essential (primary) hypertension: Secondary | ICD-10-CM

## 2015-09-07 MED ORDER — AMLODIPINE BESYLATE 10 MG PO TABS: ORAL_TABLET | ORAL | Status: DC

## 2015-09-07 MED ORDER — CLONIDINE HCL 0.1 MG PO TABS: 0.1000 mg | ORAL_TABLET | Freq: Three times a day (TID) | ORAL | Status: DC

## 2015-09-07 NOTE — Progress Notes (Signed)
Name: Krista Randall   MRN: [id number]    DOB: 01/18/1921   Date:09/07/2015       Progress Note  Subjective  Chief Complaint  Chief Complaint  Patient presents with  . Hypertension    HPI Here for f/u of HBP.  Also has incontinence.  She stopped her Detrol because of dry mouth buBPs at [facility] have run 200 suadmits that it did help her bladder.  Her BPs at home have run [id number] systolic all week.  No problem-specific assessment & plan notes found for this encounter.   Past Medical History  Diagnosis Date  . Breast cancer ([place]) 2009    left breast  . Breast mass march 2016    rt lump @ [place]   . Hernia, hiatal     Past Surgical History  Procedure Laterality Date  . Mastectomy Left 2009  . Breast biopsy Right 1980's    negative  . Cholecystectomy      Family History  Problem Relation Age of Onset  . Colon cancer Mother 57  . Cancer Brother 72    lung  . Diabetes Mellitus I Daughter     Social History   Social History  . Marital Status: Widowed    Spouse Name: N/A  . Number of Children: N/A  . Years of Education: N/A   Occupational History  . retired    Social History Main Topics  . Smoking status: Never Smoker   . Smokeless tobacco: Never Used  . Alcohol Use: No  . Drug Use: No  . Sexual Activity: Not on file   Other Topics Concern  . Not on file   Social History Narrative   Lives alone     Current outpatient prescriptions:  .  carvedilol (COREG) 3.125 MG tablet, Take 1 tablet (3.125 mg total) by mouth 2 (two) times daily with a meal., Disp: 60 tablet, Rfl: 12 .  cloNIDine (CATAPRES) 0.1 MG tablet, Take 1 tablet (0.1 mg total) by mouth 3 (three) times daily., Disp: 90 tablet, Rfl: 6 .  ferrous sulfate 325 (65 FE) MG tablet, Take 325 mg by mouth daily with breakfast., Disp: , Rfl:  .  fluticasone (FLONASE) 50 MCG/ACT nasal spray, INSTILL 1 OR 2 SPRAYS IN EACH NOSTRIL DAILY., Disp: 16 g, Rfl: 12 .  fosinopril (MONOPRIL) 40 MG tablet, Take 40 mg by  mouth daily., Disp: , Rfl:  .  hydrochlorothiazide (MICROZIDE) 12.5 MG capsule, Take 12.5 mg by mouth daily., Disp: , Rfl:  .  latanoprost (XALATAN) 0.005 % ophthalmic solution, , Disp: , Rfl: 4 .  levothyroxine (SYNTHROID, LEVOTHROID) 75 MCG tablet, Take 75 mcg by mouth daily., Disp: , Rfl: 11 .  pantoprazole (PROTONIX) 40 MG tablet, Take 40 mg by mouth daily., Disp: , Rfl:  .  potassium chloride (K-DUR) 10 MEQ tablet, Take 1 tablet (10 mEq total) by mouth daily., Disp: 30 tablet, Rfl: 0 .  Probiotic Product (DIGESTIVE ADVANTAGE PO), Take 1 tablet by mouth daily., Disp: , Rfl:  .  sucralfate (CARAFATE) 1 g tablet, , Disp: , Rfl: 5 .  amLODipine (NORVASC) 10 MG tablet, Take 1/2 tablet daily for BP, Disp: 30 tablet, Rfl: 6 .  tolterodine (DETROL LA) 2 MG 24 hr capsule, Take 1 capsule (2 mg total) by mouth daily. (Patient not taking: Reported on 09/07/2015), Disp: 30 capsule, Rfl: 6  No Known Allergies   Review of Systems  Constitutional: Negative for fever, chills, weight loss and malaise/fatigue.  HENT: Negative  for hearing loss.   Eyes: Negative for blurred vision and double vision.  Respiratory: Negative for cough, shortness of breath and wheezing.   Cardiovascular: Negative for chest pain, palpitations and leg swelling.  Gastrointestinal: Negative for heartburn, abdominal pain and blood in stool.  Genitourinary: Positive for urgency and frequency. Negative for dysuria.  Musculoskeletal: Negative for myalgias and joint pain.  Skin: Negative for rash.  Neurological: Positive for dizziness. Negative for weakness and headaches.      Objective  Filed Vitals:   09/07/15 1528 09/07/15 1627  BP: 208/78 200/90  Pulse: 61   Temp: 98.2 F (36.8 C)   TempSrc: Oral   Resp: 16   Height: 4\' 9"  (1.448 m)   Weight: 103 lb (46.72 kg)     Physical Exam  Constitutional: She is oriented to person, place, and time and well-developed, well-nourished, and in no distress. No distress.  HENT:   Head: Normocephalic and atraumatic.  Eyes: Conjunctivae and EOM are normal. Pupils are equal, round, and reactive to light. No scleral icterus.  Neck: Normal range of motion. Neck supple. Carotid bruit is not present. No thyromegaly present.  Cardiovascular: Normal rate, regular rhythm and normal heart sounds.  Exam reveals no gallop and no friction rub.   No murmur heard. Pulmonary/Chest: Effort normal and breath sounds normal. No respiratory distress. She has no wheezes. She has no rales.  Musculoskeletal: She exhibits no edema.  Lymphadenopathy:    She has no cervical adenopathy.  Neurological: She is alert and oriented to person, place, and time.  Vitals reviewed.      Recent Results (from the past 2160 hour(s))  Basic Metabolic Panel (BMET)     Status: Abnormal   Collection Time: 08/20/15 11:20 AM  Result Value Ref Range   Glucose 151 (H) 65 - 99 mg/dL   BUN 15 10 - 36 mg/dL   Creatinine, Ser 0.84 0.57 - 1.00 mg/dL   GFR calc non Af Amer 60 >59 mL/min/1.73   GFR calc Af Amer 69 >59 mL/min/1.73   BUN/Creatinine Ratio 18 12 - 28   Sodium 145 (H) 134 - 144 mmol/L   Potassium 4.0 3.5 - 5.2 mmol/L   Chloride 102 96 - 106 mmol/L   CO2 23 18 - 29 mmol/L   Calcium 9.5 8.7 - 10.3 mg/dL  POCT HgB A1C     Status: Normal   Collection Time: 08/24/15 10:03 AM  Result Value Ref Range   Hemoglobin A1C 6.1   POCT urinalysis dipstick     Status: Abnormal   Collection Time: 08/24/15 10:04 AM  Result Value Ref Range   Color, UA yellow    Clarity, UA clear    Glucose, UA neg    Bilirubin, UA neg    Ketones, UA neg    Spec Grav, UA 1.010    Blood, UA neg    pH, UA 6.5    Protein, UA 30    Urobilinogen, UA 0.2    Nitrite, UA neg    Leukocytes, UA Negative Negative  Basic Metabolic Panel (BMET)     Status: Abnormal   Collection Time: 08/30/15 11:38 AM  Result Value Ref Range   Glucose 111 (H) 65 - 99 mg/dL   BUN 14 10 - 36 mg/dL   Creatinine, Ser 0.79 0.57 - 1.00 mg/dL   GFR  calc non Af Amer 65 >59 mL/min/1.73   GFR calc Af Amer 75 >59 mL/min/1.73   BUN/Creatinine Ratio 18 12 - 28  Sodium 145 (H) 134 - 144 mmol/L   Potassium 3.6 3.5 - 5.2 mmol/L   Chloride 103 96 - 106 mmol/L   CO2 25 18 - 29 mmol/L   Calcium 9.4 8.7 - 10.3 mg/dL     Assessment & Plan  Problem List Items Addressed This Visit      Cardiovascular and Mediastinum   Benign essential HTN - Primary   Relevant Medications   cloNIDine (CATAPRES) 0.1 MG tablet   amLODipine (NORVASC) 10 MG tablet      Meds ordered this encounter  Medications  . cloNIDine (CATAPRES) 0.1 MG tablet    Sig: Take 1 tablet (0.1 mg total) by mouth 3 (three) times daily.    Dispense:  90 tablet    Refill:  6  . amLODipine (NORVASC) 10 MG tablet    Sig: Take 1/2 tablet daily for BP    Dispense:  30 tablet    Refill:  6   1. Benign essential HTN  - cloNIDine (CATAPRES) 0.1 MG tablet; Take 1 tablet (0.1 mg total) by mouth 3 (three) times daily.  Dispense: 90 tablet; Refill: 6 - amLODipine (NORVASC) 10 MG tablet; Take 1/2 tablet daily for BP  Dispense: 30 tablet; Refill: 6 Cont. HCTZ, Carvedilol, Fosinopril daily RTC-2 weeks

## 2015-09-13 ENCOUNTER — Ambulatory Visit: Payer: Medicare Other

## 2015-09-20 ENCOUNTER — Encounter: Payer: Self-pay | Admitting: Family Medicine

## 2015-09-20 ENCOUNTER — Ambulatory Visit (INDEPENDENT_AMBULATORY_CARE_PROVIDER_SITE_OTHER): Payer: Medicare Other | Admitting: Family Medicine

## 2015-09-20 VITALS — BP 180/70 | HR 80 | Temp 98.8°F | Resp 16 | Ht <= 58 in | Wt 100.8 lb

## 2015-09-20 DIAGNOSIS — I10 Essential (primary) hypertension: Secondary | ICD-10-CM | POA: Diagnosis not present

## 2015-09-20 DIAGNOSIS — R636 Underweight: Secondary | ICD-10-CM | POA: Insufficient documentation

## 2015-09-20 MED ORDER — CLONIDINE HCL 0.2 MG PO TABS: 0.1000 mg | ORAL_TABLET | Freq: Three times a day (TID) | ORAL | Status: DC

## 2015-09-20 MED ORDER — CARVEDILOL 6.25 MG PO TABS: 3.1250 mg | ORAL_TABLET | Freq: Two times a day (BID) | ORAL | Status: DC

## 2015-09-20 MED ORDER — LISINOPRIL 40 MG PO TABS: 40.0000 mg | ORAL_TABLET | Freq: Every day | ORAL | Status: DC

## 2015-09-20 NOTE — Progress Notes (Signed)
Name: Krista Randall   MRN: [id number]    DOB: [birth year]   Date:09/20/2015       Progress Note  Subjective  Chief Complaint  Chief Complaint  Patient presents with  . Hypertension    HPI Form f/u of HBP and weight loss.  She has stopped HCTZ because of excessive urination and Detrol LA.  She has also stopped Clonidine b/u dry mouth.  She is not eating well and cont. To  Lose weight.  Her feet swell but this does not bother her.  No problem-specific assessment & plan notes found for this encounter.   Past Medical History  Diagnosis Date  . Breast cancer ([place]) 2009    left breast  . Breast mass march 2016    rt lump @ [facility]   . Hernia, hiatal     Past Surgical History  Procedure Laterality Date  . Mastectomy Left 2009  . Breast biopsy Right 1980's    negative  . Cholecystectomy      Family History  Problem Relation Age of Onset  . Colon cancer Mother 46  . Cancer Brother 46    lung  . Diabetes Mellitus I Daughter     Social History   Social History  . Marital Status: Widowed    Spouse Name: N/A  . Number of Children: N/A  . Years of Education: N/A   Occupational History  . retired    Social History Main Topics  . Smoking status: Never Smoker   . Smokeless tobacco: Never Used  . Alcohol Use: No  . Drug Use: No  . Sexual Activity: Not on file   Other Topics Concern  . Not on file   Social History Narrative   Lives alone     Current outpatient prescriptions:  .  amLODipine (NORVASC) 10 MG tablet, Take 1/2 tablet daily for BP, Disp: 30 tablet, Rfl: 6 .  carvedilol (COREG) 6.25 MG tablet, Take 0.5 tablets (3.125 mg total) by mouth 2 (two) times daily with a meal., Disp: 60 tablet, Rfl: 12 .  ferrous sulfate 325 (65 FE) MG tablet, Take 325 mg by mouth daily with breakfast., Disp: , Rfl:  .  fluticasone (FLONASE) 50 MCG/ACT nasal spray, INSTILL 1 OR 2 SPRAYS IN EACH NOSTRIL DAILY., Disp: 16 g, Rfl: 12 .  latanoprost (XALATAN) 0.005 % ophthalmic  solution, , Disp: , Rfl: 4 .  levothyroxine (SYNTHROID, LEVOTHROID) 75 MCG tablet, Take 75 mcg by mouth daily., Disp: , Rfl: 11 .  pantoprazole (PROTONIX) 40 MG tablet, Take 40 mg by mouth daily., Disp: , Rfl:  .  potassium chloride (K-DUR) 10 MEQ tablet, Take 1 tablet (10 mEq total) by mouth daily., Disp: 30 tablet, Rfl: 0 .  Probiotic Product (DIGESTIVE ADVANTAGE PO), Take 1 tablet by mouth daily., Disp: , Rfl:  .  sucralfate (CARAFATE) 1 g tablet, , Disp: , Rfl: 5 .  tolterodine (DETROL LA) 2 MG 24 hr capsule, Take 1 capsule (2 mg total) by mouth daily., Disp: 30 capsule, Rfl: 6 .  cloNIDine (CATAPRES) 0.2 MG tablet, Take 0.5 tablets (0.1 mg total) by mouth 3 (three) times daily., Disp: 90 tablet, Rfl: 6 .  hydrochlorothiazide (MICROZIDE) 12.5 MG capsule, Take 12.5 mg by mouth daily. Reported on 09/20/2015, Disp: , Rfl:  .  lisinopril (PRINIVIL,ZESTRIL) 40 MG tablet, Take 1 tablet (40 mg total) by mouth daily., Disp: 90 tablet, Rfl: 3  Not on File   Review of Systems  Constitutional: Positive for  weight loss. Negative for fever, chills and malaise/fatigue.  HENT: Negative for hearing loss.   Eyes: Negative for blurred vision and double vision.  Respiratory: Positive for shortness of breath (with activity and early AM.). Negative for cough and wheezing.   Cardiovascular: Positive for leg swelling. Negative for chest pain and palpitations.  Gastrointestinal: Negative for heartburn, abdominal pain, blood in stool and melena.  Genitourinary: Positive for frequency. Negative for dysuria and urgency.  Skin: Negative for rash.  Neurological: Positive for dizziness. Negative for tremors, weakness and headaches.      Objective  Filed Vitals:   09/20/15 0932 09/20/15 1010  BP: 196/73 180/70  Pulse: 79 80  Temp: 98.8 F (37.1 C)   TempSrc: Oral   Resp: 16   Height: 4\' 9"  (1.448 m)   Weight: 100 lb 12.8 oz (45.723 kg)     Physical Exam  Constitutional: She is oriented to person,  place, and time and well-developed, well-nourished, and in no distress. No distress.  HENT:  Head: Normocephalic and atraumatic.  Eyes: Conjunctivae and EOM are normal. Pupils are equal, round, and reactive to light. No scleral icterus.  Neck: Normal range of motion. Neck supple. Carotid bruit is not present. No thyromegaly present.  Cardiovascular: Normal rate, regular rhythm and normal heart sounds.  Exam reveals no gallop and no friction rub.   No murmur heard. Pulmonary/Chest: Effort normal and breath sounds normal. No respiratory distress. She has no wheezes. She has no rales.  Abdominal: Soft. Bowel sounds are normal. She exhibits no distension and no mass. There is no tenderness.  Musculoskeletal: She exhibits no edema (trace bilaeral pedal edema.).  Lymphadenopathy:    She has no cervical adenopathy.  Neurological: She is alert and oriented to person, place, and time.  Vitals reviewed.      Recent Results (from the past 2160 hour(s))  Basic Metabolic Panel (BMET)     Status: Abnormal   Collection Time: 08/20/15 11:20 AM  Result Value Ref Range   Glucose 151 (H) 65 - 99 mg/dL   BUN 15 10 - 36 mg/dL   Creatinine, Ser 0.84 0.57 - 1.00 mg/dL   GFR calc non Af Amer 60 >59 mL/min/1.73   GFR calc Af Amer 69 >59 mL/min/1.73   BUN/Creatinine Ratio 18 12 - 28   Sodium 145 (H) 134 - 144 mmol/L   Potassium 4.0 3.5 - 5.2 mmol/L   Chloride 102 96 - 106 mmol/L   CO2 23 18 - 29 mmol/L   Calcium 9.5 8.7 - 10.3 mg/dL  POCT HgB A1C     Status: Normal   Collection Time: 08/24/15 10:03 AM  Result Value Ref Range   Hemoglobin A1C 6.1   POCT urinalysis dipstick     Status: Abnormal   Collection Time: 08/24/15 10:04 AM  Result Value Ref Range   Color, UA yellow    Clarity, UA clear    Glucose, UA neg    Bilirubin, UA neg    Ketones, UA neg    Spec Grav, UA 1.010    Blood, UA neg    pH, UA 6.5    Protein, UA 30    Urobilinogen, UA 0.2    Nitrite, UA neg    Leukocytes, UA Negative  Negative  Basic Metabolic Panel (BMET)     Status: Abnormal   Collection Time: 08/30/15 11:38 AM  Result Value Ref Range   Glucose 111 (H) 65 - 99 mg/dL   BUN 14 10 - 36 mg/dL  Creatinine, Ser 0.79 0.57 - 1.00 mg/dL   GFR calc non Af Amer 65 >59 mL/min/1.73   GFR calc Af Amer 75 >59 mL/min/1.73   BUN/Creatinine Ratio 18 12 - 28   Sodium 145 (H) 134 - 144 mmol/L   Potassium 3.6 3.5 - 5.2 mmol/L   Chloride 103 96 - 106 mmol/L   CO2 25 18 - 29 mmol/L   Calcium 9.4 8.7 - 10.3 mg/dL     Assessment & Plan  Problem List Items Addressed This Visit      Cardiovascular and Mediastinum   Benign essential HTN - Primary   Relevant Medications   carvedilol (COREG) 6.25 MG tablet   cloNIDine (CATAPRES) 0.2 MG tablet   lisinopril (PRINIVIL,ZESTRIL) 40 MG tablet     Other   Low weight      Meds ordered this encounter  Medications  . carvedilol (COREG) 6.25 MG tablet    Sig: Take 0.5 tablets (3.125 mg total) by mouth 2 (two) times daily with a meal.    Dispense:  60 tablet    Refill:  12  . cloNIDine (CATAPRES) 0.2 MG tablet    Sig: Take 0.5 tablets (0.1 mg total) by mouth 3 (three) times daily.    Dispense:  90 tablet    Refill:  6  . lisinopril (PRINIVIL,ZESTRIL) 40 MG tablet    Sig: Take 1 tablet (40 mg total) by mouth daily.    Dispense:  90 tablet    Refill:  3   1. Benign essential HTN  - carvedilol (COREG) 6.25 MG tablet; Take 0.5 tablets (3.125 mg total) by mouth 2 (two) times daily with a meal.  Dispense: 60 tablet; Refill: 12 - cloNIDine (CATAPRES) 0.2 MG tablet; Take 0.5 tablets (0.1 mg total) by mouth 3 (three) times daily.  Dispense: 90 tablet; Refill: 6 - lisinopril (PRINIVIL,ZESTRIL) 40 MG tablet; Take 1 tablet (40 mg total) by mouth daily.  Dispense: 90 tablet; Refill: 3 Cont Amlodipine and K+ 2. Low weight Boost to supplement diet.

## 2015-09-20 NOTE — Patient Instructions (Signed)
Discussed supplementing diet with Boost, 1-2 cans a day.

## 2015-09-28 ENCOUNTER — Telehealth: Payer: Self-pay | Admitting: Family Medicine

## 2015-09-28 NOTE — Telephone Encounter (Signed)
Dr. Luan Pulling changed the dosage on a few of pt's medications but wasn't sent to pharmacy that way.  Pt's daughter Manuela Schwartz is on her way to pt's house and will have the name of the ones that were supposed to change.  Please call [phone]

## 2015-09-28 NOTE — Telephone Encounter (Signed)
Carvedilol was changed to 1/2 tab bid and Clonidine was change to 1/2 tab tid. Lisinopril is 40 mg.

## 2015-09-29 ENCOUNTER — Other Ambulatory Visit: Payer: Self-pay | Admitting: Family Medicine

## 2015-09-29 DIAGNOSIS — I10 Essential (primary) hypertension: Secondary | ICD-10-CM

## 2015-09-29 NOTE — Telephone Encounter (Signed)
Called patient this am. She says they figured out medication and had no further questions.

## 2015-09-29 NOTE — Telephone Encounter (Signed)
Patient's daughter is questioning change in bp rx.  She was under the impression that Clonidine was 1 tab 0.2 mg tid and Coreg 6.125 mg either once daily or 3.125 bid. She will be out of town until next Tuesday.

## 2015-09-30 MED ORDER — CLONIDINE HCL 0.2 MG PO TABS: 0.1000 mg | ORAL_TABLET | Freq: Three times a day (TID) | ORAL | Status: DC

## 2015-09-30 MED ORDER — CARVEDILOL 6.25 MG PO TABS: 3.1250 mg | ORAL_TABLET | Freq: Two times a day (BID) | ORAL | Status: DC

## 2015-10-06 ENCOUNTER — Other Ambulatory Visit: Payer: Self-pay | Admitting: Family Medicine

## 2015-10-06 DIAGNOSIS — I10 Essential (primary) hypertension: Secondary | ICD-10-CM

## 2015-10-06 MED ORDER — CARVEDILOL 6.25 MG PO TABS: 6.2500 mg | ORAL_TABLET | Freq: Two times a day (BID) | ORAL | Status: AC

## 2015-10-06 MED ORDER — CLONIDINE HCL 0.2 MG PO TABS: 0.2000 mg | ORAL_TABLET | Freq: Three times a day (TID) | ORAL | Status: DC

## 2015-10-07 ENCOUNTER — Telehealth: Payer: Self-pay | Admitting: Family Medicine

## 2015-10-07 NOTE — Telephone Encounter (Signed)
Updated rx has been sent to pharmacy Krista Randall notified.[name]

## 2015-10-07 NOTE — Telephone Encounter (Signed)
Pt's daughter Zariha Komoroski has questions about pt's medications clonidine and carvedilol.  She also asked for a prescription for a pulse ox meter to see if insurance would pay.  Her call back number is [phone]

## 2015-10-18 ENCOUNTER — Ambulatory Visit (INDEPENDENT_AMBULATORY_CARE_PROVIDER_SITE_OTHER): Payer: Medicare Other | Admitting: Family Medicine

## 2015-10-18 ENCOUNTER — Encounter: Payer: Self-pay | Admitting: Family Medicine

## 2015-10-18 VITALS — BP 161/73 | HR 79 | Temp 97.4°F | Resp 16 | Ht <= 58 in | Wt 99.8 lb

## 2015-10-18 DIAGNOSIS — K922 Gastrointestinal hemorrhage, unspecified: Secondary | ICD-10-CM | POA: Diagnosis not present

## 2015-10-18 LAB — COMPLETE METABOLIC PANEL WITH GFR
ALBUMIN: 4.2 g/dL (ref 3.6–5.1)
ALK PHOS: 78 U/L (ref 33–130)
ALT: 12 U/L (ref 6–29)
AST: 18 U/L (ref 10–35)
BILIRUBIN TOTAL: 0.4 mg/dL (ref 0.2–1.2)
BUN: 17 mg/dL (ref 7–25)
CALCIUM: 9.5 mg/dL (ref 8.6–10.4)
CO2: 27 mmol/L (ref 20–31)
CREATININE: 0.93 mg/dL — AB (ref 0.60–0.88)
Chloride: 105 mmol/L (ref 98–110)
GFR, EST AFRICAN AMERICAN: 61 mL/min (ref >=60)
GFR, EST NON AFRICAN AMERICAN: 53 mL/min — AB (ref >=60)
Glucose, Bld: 157 mg/dL — ABNORMAL HIGH (ref 65–99)
Potassium: 3.9 mmol/L (ref 3.5–5.3)
Sodium: 143 mmol/L (ref 135–146)
Total Protein: 6.4 g/dL (ref 6.1–8.1)

## 2015-10-18 LAB — CBC WITH DIFFERENTIAL/PLATELET
BASOS ABS: 0 {cells}/uL (ref 0–200)
Basophils Relative: 0 %
EOS ABS: 57 {cells}/uL (ref 15–500)
Eosinophils Relative: 1 %
HCT: 35.4 % (ref 35.0–45.0)
HEMOGLOBIN: 11.4 g/dL — AB (ref 11.7–15.5)
LYMPHS ABS: 1197 {cells}/uL (ref 850–3900)
Lymphocytes Relative: 21 %
MCH: 28.1 pg (ref 27.0–33.0)
MCHC: 32.2 g/dL (ref 32.0–36.0)
MCV: 87.4 fL (ref 80.0–100.0)
MONOS PCT: 11 %
MPV: 8.5 fL (ref 7.5–12.5)
Monocytes Absolute: 627 cells/uL (ref 200–950)
NEUTROS ABS: 3819 {cells}/uL (ref 1500–7800)
Neutrophils Relative %: 67 %
Platelets: 282 10*3/uL (ref 140–400)
RBC: 4.05 MIL/uL (ref 3.80–5.10)
RDW: 15.4 % — ABNORMAL HIGH (ref 11.0–15.0)
WBC: 5.7 10*3/uL (ref 3.8–10.8)

## 2015-10-18 NOTE — Progress Notes (Signed)
Name: Krista Randall   MRN: [id number]    DOB: 1921-05-01   Date:10/18/2015       Progress Note  Subjective  Chief Complaint  Chief Complaint  Patient presents with  . Hematemesis    HPI Here because she vomited blood last Tuesday (6 days ago).    Stools have been black for 3 days.  Seem to be less dark today.  Had some epigastric pain that started with vomiting. She has had a GI bleed in past with Hb of 6 (2 years ago). Has been hospitalized again for GI bleed 6 months ago.  Her epigastric pain is better today. She is still nauseated on and off.  She has not been taking her BP meds regularly past several days because of nausea./ No problem-specific assessment & plan notes found for this encounter.   Past Medical History  Diagnosis Date  . Breast cancer ([place]) 2009    left breast  . Breast mass march 2016    rt lump @ [facility]   . Hernia, hiatal     Social History  Substance Use Topics  . Smoking status: Never Smoker   . Smokeless tobacco: Never Used  . Alcohol Use: No     Current outpatient prescriptions:  .  amLODipine (NORVASC) 10 MG tablet, Take 1/2 tablet daily for BP, Disp: 30 tablet, Rfl: 6 .  carvedilol (COREG) 6.25 MG tablet, Take 1 tablet (6.25 mg total) by mouth 2 (two) times daily with a meal., Disp: 60 tablet, Rfl: 12 .  cloNIDine (CATAPRES) 0.2 MG tablet, Take 1 tablet (0.2 mg total) by mouth 3 (three) times daily., Disp: 90 tablet, Rfl: 6 .  ferrous sulfate 325 (65 FE) MG tablet, Take 325 mg by mouth daily with breakfast., Disp: , Rfl:  .  fluticasone (FLONASE) 50 MCG/ACT nasal spray, INSTILL 1 OR 2 SPRAYS IN EACH NOSTRIL DAILY., Disp: 16 g, Rfl: 12 .  latanoprost (XALATAN) 0.005 % ophthalmic solution, , Disp: , Rfl: 4 .  levothyroxine (SYNTHROID, LEVOTHROID) 75 MCG tablet, Take 75 mcg by mouth daily., Disp: , Rfl: 11 .  lisinopril (PRINIVIL,ZESTRIL) 40 MG tablet, Take 1 tablet (40 mg total) by mouth daily., Disp: 90 tablet, Rfl: 3 .  pantoprazole (PROTONIX)  40 MG tablet, Take 40 mg by mouth daily., Disp: , Rfl:  .  potassium chloride (K-DUR) 10 MEQ tablet, Take 1 tablet (10 mEq total) by mouth daily., Disp: 30 tablet, Rfl: 0 .  Probiotic Product (DIGESTIVE ADVANTAGE PO), Take 1 tablet by mouth daily., Disp: , Rfl:  .  sucralfate (CARAFATE) 1 g tablet, , Disp: , Rfl: 5 .  tolterodine (DETROL LA) 2 MG 24 hr capsule, Take 1 capsule (2 mg total) by mouth daily., Disp: 30 capsule, Rfl: 6  No Known Allergies  Review of Systems  Constitutional: Negative for fever, chills, weight loss and malaise/fatigue.  HENT: Negative for hearing loss.   Eyes: Negative for blurred vision and double vision.  Respiratory: Negative for cough, shortness of breath and wheezing.   Cardiovascular: Positive for leg swelling (mild). Negative for chest pain and palpitations.  Gastrointestinal: Positive for vomiting (dark blood) and melena.  Genitourinary: Negative for dysuria, urgency and frequency.  Skin: Negative for rash.  Neurological: Negative for weakness and headaches.      Objective  Filed Vitals:   10/18/15 1332  BP: 161/73  Pulse: 79  Temp: 97.4 F (36.3 C)  TempSrc: Oral  Resp: 16  Height: 4\' 9"  (1.448 m)  Weight: 99 lb 12.8 oz (45.269 kg)     Physical Exam  Constitutional: She is oriented to person, place, and time and well-developed, well-nourished, and in no distress. No distress.  HENT:  Head: Normocephalic and atraumatic.  Cardiovascular: Normal rate and regular rhythm.   Abdominal: Soft. Bowel sounds are normal. She exhibits no distension and no mass. There is no tenderness. There is no rebound and no guarding.  Grossly melenotic stool that is immediately Hemocult positive.  Musculoskeletal: She exhibits edema (trace).  Neurological: She is alert and oriented to person, place, and time.  Vitals reviewed.     Recent Results (from the past 2160 hour(s))  Basic Metabolic Panel (BMET)     Status: Abnormal   Collection Time: 08/20/15  11:20 AM  Result Value Ref Range   Glucose 151 (H) 65 - 99 mg/dL   BUN 15 10 - 36 mg/dL   Creatinine, Ser 0.84 0.57 - 1.00 mg/dL   GFR calc non Af Amer 60 >59 mL/min/1.73   GFR calc Af Amer 69 >59 mL/min/1.73   BUN/Creatinine Ratio 18 12 - 28   Sodium 145 (H) 134 - 144 mmol/L   Potassium 4.0 3.5 - 5.2 mmol/L   Chloride 102 96 - 106 mmol/L   CO2 23 18 - 29 mmol/L   Calcium 9.5 8.7 - 10.3 mg/dL  POCT HgB A1C     Status: Normal   Collection Time: 08/24/15 10:03 AM  Result Value Ref Range   Hemoglobin A1C 6.1   POCT urinalysis dipstick     Status: Abnormal   Collection Time: 08/24/15 10:04 AM  Result Value Ref Range   Color, UA yellow    Clarity, UA clear    Glucose, UA neg    Bilirubin, UA neg    Ketones, UA neg    Spec Grav, UA 1.010    Blood, UA neg    pH, UA 6.5    Protein, UA 30    Urobilinogen, UA 0.2    Nitrite, UA neg    Leukocytes, UA Negative Negative  Basic Metabolic Panel (BMET)     Status: Abnormal   Collection Time: 08/30/15 11:38 AM  Result Value Ref Range   Glucose 111 (H) 65 - 99 mg/dL   BUN 14 10 - 36 mg/dL   Creatinine, Ser 0.79 0.57 - 1.00 mg/dL   GFR calc non Af Amer 65 >59 mL/min/1.73   GFR calc Af Amer 75 >59 mL/min/1.73   BUN/Creatinine Ratio 18 12 - 28   Sodium 145 (H) 134 - 144 mmol/L   Potassium 3.6 3.5 - 5.2 mmol/L   Chloride 103 96 - 106 mmol/L   CO2 25 18 - 29 mmol/L   Calcium 9.4 8.7 - 10.3 mg/dL     Assessment & Plan  1. Acute GI bleeding  - CBC with Differential - COMPLETE METABOLIC PANEL WITH GFR  Patient was not in any distress today and declined going to hospital at this time.

## 2015-10-26 ENCOUNTER — Ambulatory Visit (INDEPENDENT_AMBULATORY_CARE_PROVIDER_SITE_OTHER): Payer: Medicare Other | Admitting: Family Medicine

## 2015-10-26 ENCOUNTER — Encounter: Payer: Self-pay | Admitting: Family Medicine

## 2015-10-26 VITALS — BP 200/60 | HR 65 | Temp 98.0°F | Resp 16 | Ht <= 58 in

## 2015-10-26 DIAGNOSIS — I10 Essential (primary) hypertension: Secondary | ICD-10-CM | POA: Diagnosis not present

## 2015-10-26 DIAGNOSIS — K2901 Acute gastritis with bleeding: Secondary | ICD-10-CM

## 2015-10-26 LAB — CBC
HCT: 37.6 % (ref 35.0–45.0)
Hemoglobin: 12 g/dL (ref 11.7–15.5)
MCH: 27.9 pg (ref 27.0–33.0)
MCHC: 31.9 g/dL — ABNORMAL LOW (ref 32.0–36.0)
MCV: 87.4 fL (ref 80.0–100.0)
MPV: 8.3 fL (ref 7.5–12.5)
PLATELETS: 333 10*3/uL (ref 140–400)
RBC: 4.3 MIL/uL (ref 3.80–5.10)
RDW: 15.2 % — AB (ref 11.0–15.0)
WBC: 6.7 10*3/uL (ref 3.8–10.8)

## 2015-10-26 MED ORDER — CLONIDINE HCL 0.3 MG PO TABS: 0.3000 mg | ORAL_TABLET | Freq: Three times a day (TID) | ORAL | Status: DC

## 2015-10-26 NOTE — Progress Notes (Signed)
Name: Krista Randall   MRN: [id number]    DOB: 18-Apr-1921   Date:10/26/2015       Progress Note  Subjective  Chief Complaint  Chief Complaint  Patient presents with  . GI Bleeding    f/u    HPI Here for f/u of GI bleed (upper) from 2 weks ago.  GI consult ([facility]) recommended Carafate and PPI only.  Patient reports that nausea has resolved and stool no longer black.  BPs still elevated at home.  No problem-specific assessment & plan notes found for this encounter.   Past Medical History  Diagnosis Date  . Breast cancer ([place]) 2009    left breast  . Breast mass march 2016    rt lump @ [facility]   . Hernia, hiatal     Past Surgical History  Procedure Laterality Date  . Mastectomy Left 2009  . Breast biopsy Right 1980's    negative  . Cholecystectomy      Family History  Problem Relation Age of Onset  . Colon cancer Mother 53  . Cancer Brother 71    lung  . Diabetes Mellitus I Daughter     Social History   Social History  . Marital Status: Widowed    Spouse Name: N/A  . Number of Children: N/A  . Years of Education: N/A   Occupational History  . retired    Social History Main Topics  . Smoking status: Never Smoker   . Smokeless tobacco: Never Used  . Alcohol Use: No  . Drug Use: No  . Sexual Activity: Not on file   Other Topics Concern  . Not on file   Social History Narrative   Lives alone     Current outpatient prescriptions:  .  amLODipine (NORVASC) 10 MG tablet, Take 1/2 tablet daily for BP, Disp: 30 tablet, Rfl: 6 .  carvedilol (COREG) 6.25 MG tablet, Take 1 tablet (6.25 mg total) by mouth 2 (two) times daily with a meal., Disp: 60 tablet, Rfl: 12 .  cloNIDine (CATAPRES) 0.3 MG tablet, Take 1 tablet (0.3 mg total) by mouth 3 (three) times daily., Disp: 90 tablet, Rfl: 6 .  ferrous sulfate 325 (65 FE) MG tablet, Take 325 mg by mouth daily with breakfast., Disp: , Rfl:  .  fluticasone (FLONASE) 50 MCG/ACT nasal spray, INSTILL 1 OR 2 SPRAYS IN EACH  NOSTRIL DAILY., Disp: 16 g, Rfl: 12 .  latanoprost (XALATAN) 0.005 % ophthalmic solution, , Disp: , Rfl: 4 .  levothyroxine (SYNTHROID, LEVOTHROID) 75 MCG tablet, Take 75 mcg by mouth daily., Disp: , Rfl: 11 .  lisinopril (PRINIVIL,ZESTRIL) 40 MG tablet, Take 1 tablet (40 mg total) by mouth daily., Disp: 90 tablet, Rfl: 3 .  pantoprazole (PROTONIX) 40 MG tablet, Take 40 mg by mouth daily., Disp: , Rfl:  .  potassium chloride (K-DUR) 10 MEQ tablet, Take 1 tablet (10 mEq total) by mouth daily., Disp: 30 tablet, Rfl: 0 .  Probiotic Product (DIGESTIVE ADVANTAGE PO), Take 1 tablet by mouth daily., Disp: , Rfl:  .  sucralfate (CARAFATE) 1 g tablet, , Disp: , Rfl: 5 .  tolterodine (DETROL LA) 2 MG 24 hr capsule, Take 1 capsule (2 mg total) by mouth daily., Disp: 30 capsule, Rfl: 6  Not on File   Review of Systems  Constitutional: Positive for malaise/fatigue. Negative for fever, chills and weight loss.  HENT: Negative for hearing loss.   Eyes: Negative for blurred vision and double vision.  Respiratory: Negative  for cough, shortness of breath and wheezing.   Cardiovascular: Negative for chest pain, palpitations and leg swelling.  Gastrointestinal: Negative for heartburn, abdominal pain, diarrhea, constipation, blood in stool and melena.  Genitourinary: Negative for dysuria, urgency and frequency.  Skin: Negative for rash.  Neurological: Positive for dizziness. Negative for weakness and headaches.      Objective  Filed Vitals:   10/26/15 0950 10/26/15 1035  BP: 199/74 200/60  Pulse: 65   Temp: 98 F (36.7 C)   TempSrc: Oral   Resp: 16   Height: 4' 10"  (1.473 m)     Physical Exam  Constitutional: She is oriented to person, place, and time. No distress.  Thin and small in stature.  HENT:  Head: Normocephalic and atraumatic.  Neck: Normal range of motion. Neck supple. Carotid bruit is not present. No thyromegaly present.  Cardiovascular: Normal rate, regular rhythm and normal heart  sounds.  Exam reveals no gallop and no friction rub.   No murmur heard. Pulmonary/Chest: Effort normal and breath sounds normal. No respiratory distress. She has no wheezes. She has no rales.  Abdominal: Soft. Bowel sounds are normal. She exhibits no distension and no mass. There is no tenderness.  Musculoskeletal: She exhibits edema (trace bilateral pedal edema.).  Lymphadenopathy:    She has no cervical adenopathy.  Neurological: She is alert and oriented to person, place, and time.  Vitals reviewed.      Recent Results (from the past 2160 hour(s))  Basic Metabolic Panel (BMET)     Status: Abnormal   Collection Time: 08/20/15 11:20 AM  Result Value Ref Range   Glucose 151 (H) 65 - 99 mg/dL   BUN 15 10 - 36 mg/dL   Creatinine, Ser 0.84 0.57 - 1.00 mg/dL   GFR calc non Af Amer 60 >59 mL/min/1.73   GFR calc Af Amer 69 >59 mL/min/1.73   BUN/Creatinine Ratio 18 12 - 28   Sodium 145 (H) 134 - 144 mmol/L   Potassium 4.0 3.5 - 5.2 mmol/L   Chloride 102 96 - 106 mmol/L   CO2 23 18 - 29 mmol/L   Calcium 9.5 8.7 - 10.3 mg/dL  POCT HgB A1C     Status: Normal   Collection Time: 08/24/15 10:03 AM  Result Value Ref Range   Hemoglobin A1C 6.1   POCT urinalysis dipstick     Status: Abnormal   Collection Time: 08/24/15 10:04 AM  Result Value Ref Range   Color, UA yellow    Clarity, UA clear    Glucose, UA neg    Bilirubin, UA neg    Ketones, UA neg    Spec Grav, UA 1.010    Blood, UA neg    pH, UA 6.5    Protein, UA 30    Urobilinogen, UA 0.2    Nitrite, UA neg    Leukocytes, UA Negative Negative  Basic Metabolic Panel (BMET)     Status: Abnormal   Collection Time: 08/30/15 11:38 AM  Result Value Ref Range   Glucose 111 (H) 65 - 99 mg/dL   BUN 14 10 - 36 mg/dL   Creatinine, Ser 0.79 0.57 - 1.00 mg/dL   GFR calc non Af Amer 65 >59 mL/min/1.73   GFR calc Af Amer 75 >59 mL/min/1.73   BUN/Creatinine Ratio 18 12 - 28   Sodium 145 (H) 134 - 144 mmol/L   Potassium 3.6 3.5 - 5.2  mmol/L   Chloride 103 96 - 106 mmol/L   CO2 25 18 -  29 mmol/L   Calcium 9.4 8.7 - 10.3 mg/dL  CBC with Differential     Status: Abnormal   Collection Time: 10/18/15  2:46 PM  Result Value Ref Range   WBC 5.7 3.8 - 10.8 K/uL   RBC 4.05 3.80 - 5.10 MIL/uL   Hemoglobin 11.4 (L) 11.7 - 15.5 g/dL   HCT 35.4 35.0 - 45.0 %   MCV 87.4 80.0 - 100.0 fL   MCH 28.1 27.0 - 33.0 pg   MCHC 32.2 32.0 - 36.0 g/dL   RDW 15.4 (H) 11.0 - 15.0 %   Platelets 282 140 - 400 K/uL   MPV 8.5 7.5 - 12.5 fL   Neutro Abs 3819 1500 - 7800 cells/uL   Lymphs Abs 1197 850 - 3900 cells/uL   Monocytes Absolute 627 200 - 950 cells/uL   Eosinophils Absolute 57 15 - 500 cells/uL   Basophils Absolute 0 0 - 200 cells/uL   Neutrophils Relative % 67 %   Lymphocytes Relative 21 %   Monocytes Relative 11 %   Eosinophils Relative 1 %   Basophils Relative 0 %   Smear Review Criteria for review not met     Comment: ** Please note change in unit of measure and reference range(s). **  COMPLETE METABOLIC PANEL WITH GFR     Status: Abnormal   Collection Time: 10/18/15  2:46 PM  Result Value Ref Range   Sodium 143 135 - 146 mmol/L   Potassium 3.9 3.5 - 5.3 mmol/L   Chloride 105 98 - 110 mmol/L   CO2 27 20 - 31 mmol/L   Glucose, Bld 157 (H) 65 - 99 mg/dL   BUN 17 7 - 25 mg/dL   Creat 0.93 (H) 0.60 - 0.88 mg/dL    Comment:   For patients > or = 80 years of age: The upper reference limit for Creatinine is approximately 13% higher for people identified as African-American.      Total Bilirubin 0.4 0.2 - 1.2 mg/dL   Alkaline Phosphatase 78 33 - 130 U/L   AST 18 10 - 35 U/L   ALT 12 6 - 29 U/L   Total Protein 6.4 6.1 - 8.1 g/dL   Albumin 4.2 3.6 - 5.1 g/dL   Calcium 9.5 8.6 - 10.4 mg/dL   GFR, Est African American 61 >=60 mL/min   GFR, Est Non African American 53 (L) >=60 mL/min     Assessment & Plan  Problem List Items Addressed This Visit      Cardiovascular and Mediastinum   Benign essential HTN   Relevant  Medications   cloNIDine (CATAPRES) 0.3 MG tablet   Essential (primary) hypertension - Primary   Relevant Medications   cloNIDine (CATAPRES) 0.3 MG tablet     Digestive   GI bleeding   Relevant Orders   CBC      Meds ordered this encounter  Medications  . cloNIDine (CATAPRES) 0.3 MG tablet    Sig: Take 1 tablet (0.3 mg total) by mouth 3 (three) times daily.    Dispense:  90 tablet    Refill:  6   1. Essential (primary) hypertension Cont other BP meds at same doses. - cloNIDine (CATAPRES) 0.3 MG tablet; Take 1 tablet (0.3 mg total) by mouth 3 (three) times daily.  Dispense: 90 tablet; Refill: 6  -- Increased from 0.2 mg three times a day.  2. Gastrointestinal hemorrhage associated with acute gastritis Cont meds - CBC  3. Benign essential HTN

## 2015-11-18 ENCOUNTER — Other Ambulatory Visit: Payer: Self-pay | Admitting: Family Medicine

## 2015-11-18 MED ORDER — POTASSIUM CHLORIDE ER 10 MEQ PO TBCR: 10.0000 meq | EXTENDED_RELEASE_TABLET | Freq: Every day | ORAL | Status: DC

## 2015-11-22 ENCOUNTER — Encounter: Payer: Self-pay | Admitting: Family Medicine

## 2015-11-22 ENCOUNTER — Ambulatory Visit (INDEPENDENT_AMBULATORY_CARE_PROVIDER_SITE_OTHER): Payer: Medicare Other | Admitting: Family Medicine

## 2015-11-22 VITALS — BP 180/75 | HR 69 | Temp 97.6°F | Resp 16 | Ht <= 58 in | Wt 105.0 lb

## 2015-11-22 DIAGNOSIS — I10 Essential (primary) hypertension: Secondary | ICD-10-CM

## 2015-11-22 DIAGNOSIS — I340 Nonrheumatic mitral (valve) insufficiency: Secondary | ICD-10-CM | POA: Diagnosis not present

## 2015-11-22 DIAGNOSIS — R42 Dizziness and giddiness: Secondary | ICD-10-CM | POA: Diagnosis not present

## 2015-11-22 MED ORDER — SPIRONOLACTONE 25 MG PO TABS: 25.0000 mg | ORAL_TABLET | Freq: Every day | ORAL | 6 refills | Status: DC

## 2015-11-22 NOTE — Progress Notes (Signed)
Name: Krista Randall   MRN: [id number]    DOB: [birth year]   Date:11/22/2015       Progress Note  Subjective  Chief Complaint  Chief Complaint  Patient presents with  . Hypertension    HPI Here for f/u of her very high BPs.  She reports BPs 160s at home.  Can be a little higher.  Feeling fairly well overall.  No problem-specific Assessment & Plan notes found for this encounter.   Past Medical History:  Diagnosis Date  . Breast cancer ([place]) 2009   left breast  . Breast mass march 2016   rt lump @ [facility]   . Hernia, hiatal     Past Surgical History:  Procedure Laterality Date  . BREAST BIOPSY Right 1980's   negative  . CHOLECYSTECTOMY    . MASTECTOMY Left 2009    Family History  Problem Relation Age of Onset  . Colon cancer Mother 34  . Cancer Brother 39    lung  . Diabetes Mellitus I Daughter     Social History   Social History  . Marital status: Widowed    Spouse name: N/A  . Number of children: N/A  . Years of education: N/A   Occupational History  . retired    Social History Main Topics  . Smoking status: Never Smoker  . Smokeless tobacco: Never Used  . Alcohol use No  . Drug use: No  . Sexual activity: Not on file   Other Topics Concern  . Not on file   Social History Narrative   Lives alone     Current Outpatient Prescriptions:  .  amLODipine (NORVASC) 10 MG tablet, Take 1/2 tablet daily for BP, Disp: 30 tablet, Rfl: 6 .  carvedilol (COREG) 6.25 MG tablet, Take 1 tablet (6.25 mg total) by mouth 2 (two) times daily with a meal., Disp: 60 tablet, Rfl: 12 .  cloNIDine (CATAPRES) 0.3 MG tablet, Take 1 tablet (0.3 mg total) by mouth 3 (three) times daily., Disp: 90 tablet, Rfl: 6 .  ferrous sulfate 325 (65 FE) MG tablet, Take 325 mg by mouth daily with breakfast., Disp: , Rfl:  .  fluticasone (FLONASE) 50 MCG/ACT nasal spray, INSTILL 1 OR 2 SPRAYS IN EACH NOSTRIL DAILY., Disp: 16 g, Rfl: 12 .  latanoprost (XALATAN) 0.005 % ophthalmic  solution, , Disp: , Rfl: 4 .  levothyroxine (SYNTHROID, LEVOTHROID) 75 MCG tablet, Take 75 mcg by mouth daily., Disp: , Rfl: 11 .  lisinopril (PRINIVIL,ZESTRIL) 40 MG tablet, Take 1 tablet (40 mg total) by mouth daily., Disp: 90 tablet, Rfl: 3 .  pantoprazole (PROTONIX) 40 MG tablet, Take 40 mg by mouth daily., Disp: , Rfl:  .  Probiotic Product (DIGESTIVE ADVANTAGE PO), Take 1 tablet by mouth daily., Disp: , Rfl:  .  sucralfate (CARAFATE) 1 g tablet, , Disp: , Rfl: 5 .  tolterodine (DETROL LA) 2 MG 24 hr capsule, Take 1 capsule (2 mg total) by mouth daily., Disp: 30 capsule, Rfl: 6 .  spironolactone (ALDACTONE) 25 MG tablet, Take 1 tablet (25 mg total) by mouth daily., Disp: 30 tablet, Rfl: 6  Not on File   Review of Systems  Constitutional: Negative for chills, fever, malaise/fatigue and weight loss.  HENT: Negative for hearing loss.   Eyes: Negative for blurred vision and double vision.  Respiratory: Negative for shortness of breath and wheezing.   Cardiovascular: Positive for leg swelling. Negative for chest pain and palpitations.  Gastrointestinal: Negative for abdominal  pain, blood in stool, diarrhea, heartburn, melena, nausea and vomiting.  Genitourinary: Negative for dysuria, frequency and urgency.  Neurological: Positive for dizziness. Negative for tremors, weakness and headaches.      Objective  Vitals:   11/22/15 1331 11/22/15 1356  BP: (!) 182/74 (!) 180/75  Pulse: 69   Resp: 16   Temp: 97.6 F (36.4 C)   TempSrc: Oral   Weight: 105 lb (47.6 kg)   Height: _0  (1.473 m)     Physical Exam  Constitutional: She is oriented to person, place, and time and well-developed, well-nourished, and in no distress. No distress.  HENT:  Head: Normocephalic and atraumatic.  Eyes: Conjunctivae and EOM are normal. Pupils are equal, round, and reactive to light. No scleral icterus.  Neck: Normal range of motion. Neck supple. Carotid bruit is not present. No thyromegaly present.   Cardiovascular: Normal rate.   Occasional extrasystoles are present. Exam reveals no gallop and no friction rub.   Murmur heard.  Systolic murmur is present with a grade of 1/6  throughout  Pulmonary/Chest: Effort normal and breath sounds normal. No respiratory distress. She has no wheezes. She has no rales.  Musculoskeletal: She exhibits edema (1+ bilateral pedal edema).  Lymphadenopathy:    She has no cervical adenopathy.  Neurological: She is alert and oriented to person, place, and time.  Vitals reviewed.      Recent Results (from the past 2160 hour(s))  Basic Metabolic Panel (BMET)     Status: Abnormal   Collection Time: 08/30/15 11:38 AM  Result Value Ref Range   Glucose 111 (H) 65 - 99 mg/dL   BUN 14 10 - 36 mg/dL   Creatinine, Ser 0.79 0.57 - 1.00 mg/dL   GFR calc non Af Amer 65 >59 mL/min/1.73   GFR calc Af Amer 75 >59 mL/min/1.73   BUN/Creatinine Ratio 18 12 - 28   Sodium 145 (H) 134 - 144 mmol/L   Potassium 3.6 3.5 - 5.2 mmol/L   Chloride 103 96 - 106 mmol/L   CO2 25 18 - 29 mmol/L   Calcium 9.4 8.7 - 10.3 mg/dL  CBC with Differential     Status: Abnormal   Collection Time: 10/18/15  2:46 PM  Result Value Ref Range   WBC 5.7 3.8 - 10.8 K/uL   RBC 4.05 3.80 - 5.10 MIL/uL   Hemoglobin 11.4 (L) 11.7 - 15.5 g/dL   HCT 35.4 35.0 - 45.0 %   MCV 87.4 80.0 - 100.0 fL   MCH 28.1 27.0 - 33.0 pg   MCHC 32.2 32.0 - 36.0 g/dL   RDW 15.4 (H) 11.0 - 15.0 %   Platelets 282 140 - 400 K/uL   MPV 8.5 7.5 - 12.5 fL   Neutro Abs 3,819 1,500 - 7,800 cells/uL   Lymphs Abs 1,197 850 - 3,900 cells/uL   Monocytes Absolute 627 200 - 950 cells/uL   Eosinophils Absolute 57 15 - 500 cells/uL   Basophils Absolute 0 0 - 200 cells/uL   Neutrophils Relative % 67 %   Lymphocytes Relative 21 %   Monocytes Relative 11 %   Eosinophils Relative 1 %   Basophils Relative 0 %   Smear Review Criteria for review not met     Comment: ** Please note change in unit of measure and reference  range(s). **  COMPLETE METABOLIC PANEL WITH GFR     Status: Abnormal   Collection Time: 10/18/15  2:46 PM  Result Value Ref Range   Sodium  143 135 - 146 mmol/L   Potassium 3.9 3.5 - 5.3 mmol/L   Chloride 105 98 - 110 mmol/L   CO2 27 20 - 31 mmol/L   Glucose, Bld 157 (H) 65 - 99 mg/dL   BUN 17 7 - 25 mg/dL   Creat 0.93 (H) 0.60 - 0.88 mg/dL    Comment:   For patients > or = 80 years of age: The upper reference limit for Creatinine is approximately 13% higher for people identified as African-American.      Total Bilirubin 0.4 0.2 - 1.2 mg/dL   Alkaline Phosphatase 78 33 - 130 U/L   AST 18 10 - 35 U/L   ALT 12 6 - 29 U/L   Total Protein 6.4 6.1 - 8.1 g/dL   Albumin 4.2 3.6 - 5.1 g/dL   Calcium 9.5 8.6 - 10.4 mg/dL   GFR, Est African American 61 >=60 mL/min   GFR, Est Non African American 53 (L) >=60 mL/min  CBC     Status: Abnormal   Collection Time: 10/26/15 11:02 AM  Result Value Ref Range   WBC 6.7 3.8 - 10.8 K/uL   RBC 4.30 3.80 - 5.10 MIL/uL   Hemoglobin 12.0 11.7 - 15.5 g/dL   HCT 37.6 35.0 - 45.0 %   MCV 87.4 80.0 - 100.0 fL   MCH 27.9 27.0 - 33.0 pg   MCHC 31.9 (L) 32.0 - 36.0 g/dL   RDW 15.2 (H) 11.0 - 15.0 %   Platelets 333 140 - 400 K/uL   MPV 8.3 7.5 - 12.5 fL    Comment: ** Please note change in unit of measure and reference range(s). **     Assessment & Plan  Problem List Items Addressed This Visit      Cardiovascular and Mediastinum   MI (mitral incompetence)   Relevant Medications   spironolactone (ALDACTONE) 25 MG tablet   Essential (primary) hypertension - Primary   Relevant Medications   spironolactone (ALDACTONE) 25 MG tablet     Other   Dizziness    Other Visit Diagnoses   None.     Meds ordered this encounter  Medications  . spironolactone (ALDACTONE) 25 MG tablet    Sig: Take 1 tablet (25 mg total) by mouth daily.    Dispense:  30 tablet    Refill:  6  1. Essential (primary) hypertension Cont others. - spironolactone  (ALDACTONE) 25 MG tablet; Take 1 tablet (25 mg total) by mouth daily.  Dispense: 30 tablet; Refill: 6 Stop Potassium at this time 2. MI (mitral incompetence)   3. Dizziness

## 2015-11-22 NOTE — Patient Instructions (Signed)
Check BMP on return

## 2015-11-29 ENCOUNTER — Encounter: Payer: Self-pay | Admitting: Family Medicine

## 2015-11-29 ENCOUNTER — Ambulatory Visit
Admission: RE | Admit: 2015-11-29 | Discharge: 2015-11-29 | Disposition: A | Discharge status: Home / Self Care | Payer: Medicare Other | Source: Ambulatory Visit | Attending: Family Medicine | Admitting: Family Medicine

## 2015-11-29 ENCOUNTER — Ambulatory Visit (INDEPENDENT_AMBULATORY_CARE_PROVIDER_SITE_OTHER): Payer: Medicare Other | Admitting: Family Medicine

## 2015-11-29 ENCOUNTER — Telehealth: Payer: Self-pay | Admitting: Family Medicine

## 2015-11-29 VITALS — BP 189/91 | HR 98 | Temp 97.7°F | Resp 16 | Ht <= 58 in | Wt 105.0 lb

## 2015-11-29 DIAGNOSIS — R0781 Pleurodynia: Secondary | ICD-10-CM | POA: Diagnosis not present

## 2015-11-29 DIAGNOSIS — N393 Stress incontinence (female) (male): Secondary | ICD-10-CM | POA: Diagnosis not present

## 2015-11-29 DIAGNOSIS — J90 Pleural effusion, not elsewhere classified: Secondary | ICD-10-CM | POA: Diagnosis not present

## 2015-11-29 NOTE — Progress Notes (Signed)
Name: Krista Randall   MRN: [id number]    DOB: 20-Mar-1921   Date:11/29/2015       Progress Note  Subjective  Chief Complaint  Chief Complaint  Patient presents with  . Back Pain    HPI Back pain.  Stumbled and fell against bricks in garage about 3-4 weeks ago.  Was "sore" in back but got completely better.  Pain started again about 2 weeks ago.  Same area hurt, but no trauma this time.  Painm is in post ribs, L lateral to thoracic spine.  OBTW- c/o urinary freq and incontinence.  No problem-specific Assessment & Plan notes found for this encounter.   Past Medical History:  Diagnosis Date  . Breast cancer ([place]) 2009   left breast  . Breast mass march 2016   rt lump @ [facility]   . Hernia, hiatal     Social History  Substance Use Topics  . Smoking status: Never Smoker  . Smokeless tobacco: Never Used  . Alcohol use No     Current Outpatient Prescriptions:  .  amLODipine (NORVASC) 10 MG tablet, Take 1/2 tablet daily for BP, Disp: 30 tablet, Rfl: 6 .  carvedilol (COREG) 6.25 MG tablet, Take 1 tablet (6.25 mg total) by mouth 2 (two) times daily with a meal., Disp: 60 tablet, Rfl: 12 .  cloNIDine (CATAPRES) 0.3 MG tablet, Take 1 tablet (0.3 mg total) by mouth 3 (three) times daily., Disp: 90 tablet, Rfl: 6 .  ferrous sulfate 325 (65 FE) MG tablet, Take 325 mg by mouth daily with breakfast., Disp: , Rfl:  .  fluticasone (FLONASE) 50 MCG/ACT nasal spray, INSTILL 1 OR 2 SPRAYS IN EACH NOSTRIL DAILY., Disp: 16 g, Rfl: 12 .  latanoprost (XALATAN) 0.005 % ophthalmic solution, , Disp: , Rfl: 4 .  levothyroxine (SYNTHROID, LEVOTHROID) 75 MCG tablet, Take 75 mcg by mouth daily., Disp: , Rfl: 11 .  lisinopril (PRINIVIL,ZESTRIL) 40 MG tablet, Take 1 tablet (40 mg total) by mouth daily., Disp: 90 tablet, Rfl: 3 .  pantoprazole (PROTONIX) 40 MG tablet, Take 40 mg by mouth daily., Disp: , Rfl:  .  Probiotic Product (DIGESTIVE ADVANTAGE PO), Take 1 tablet by mouth daily., Disp: , Rfl:  .   spironolactone (ALDACTONE) 25 MG tablet, Take 1 tablet (25 mg total) by mouth daily., Disp: 30 tablet, Rfl: 6 .  sucralfate (CARAFATE) 1 g tablet, , Disp: , Rfl: 5 .  tolterodine (DETROL LA) 2 MG 24 hr capsule, Take 1 capsule (2 mg total) by mouth daily., Disp: 30 capsule, Rfl: 6  No Known Allergies  Review of Systems  Constitutional: Negative for chills, fever, malaise/fatigue and weight loss.  HENT: Negative for hearing loss.   Eyes: Negative for blurred vision and double vision.  Respiratory: Negative for cough, shortness of breath and wheezing.   Cardiovascular: Positive for leg swelling. Negative for chest pain and palpitations.  Gastrointestinal: Negative for abdominal pain, blood in stool and heartburn.  Genitourinary: Negative for dysuria, frequency and urgency.  Musculoskeletal: Positive for back pain.  Skin: Negative for rash.  Neurological: Positive for dizziness. Negative for weakness and headaches.      Objective  Vitals:   11/29/15 1333  BP: (!) 189/91  Pulse: 98  Resp: 16  Temp: 97.7 F (36.5 C)  TempSrc: Oral  Weight: 105 lb (47.6 kg)  Height: 4' 9"  (1.448 m)     Physical Exam  Constitutional: She is well-developed, well-nourished, and in no distress. No distress.  HENT:  Head: Normocephalic and atraumatic.  Cardiovascular: Normal rate, regular rhythm and normal heart sounds.   Pulmonary/Chest: Effort normal and breath sounds normal. She exhibits tenderness (Tender to palpation over R post ribs, 2 inches L ofd lower thoracic spine.).  Vitals reviewed.     Recent Results (from the past 2160 hour(s))  CBC with Differential     Status: Abnormal   Collection Time: 10/18/15  2:46 PM  Result Value Ref Range   WBC 5.7 3.8 - 10.8 K/uL   RBC 4.05 3.80 - 5.10 MIL/uL   Hemoglobin 11.4 (L) 11.7 - 15.5 g/dL   HCT 35.4 35.0 - 45.0 %   MCV 87.4 80.0 - 100.0 fL   MCH 28.1 27.0 - 33.0 pg   MCHC 32.2 32.0 - 36.0 g/dL   RDW 15.4 (H) 11.0 - 15.0 %   Platelets  282 140 - 400 K/uL   MPV 8.5 7.5 - 12.5 fL   Neutro Abs 3,819 1,500 - 7,800 cells/uL   Lymphs Abs 1,197 850 - 3,900 cells/uL   Monocytes Absolute 627 200 - 950 cells/uL   Eosinophils Absolute 57 15 - 500 cells/uL   Basophils Absolute 0 0 - 200 cells/uL   Neutrophils Relative % 67 %   Lymphocytes Relative 21 %   Monocytes Relative 11 %   Eosinophils Relative 1 %   Basophils Relative 0 %   Smear Review Criteria for review not met     Comment: ** Please note change in unit of measure and reference range(s). **  COMPLETE METABOLIC PANEL WITH GFR     Status: Abnormal   Collection Time: 10/18/15  2:46 PM  Result Value Ref Range   Sodium 143 135 - 146 mmol/L   Potassium 3.9 3.5 - 5.3 mmol/L   Chloride 105 98 - 110 mmol/L   CO2 27 20 - 31 mmol/L   Glucose, Bld 157 (H) 65 - 99 mg/dL   BUN 17 7 - 25 mg/dL   Creat 0.93 (H) 0.60 - 0.88 mg/dL    Comment:   For patients > or = 80 years of age: The upper reference limit for Creatinine is approximately 13% higher for people identified as African-American.      Total Bilirubin 0.4 0.2 - 1.2 mg/dL   Alkaline Phosphatase 78 33 - 130 U/L   AST 18 10 - 35 U/L   ALT 12 6 - 29 U/L   Total Protein 6.4 6.1 - 8.1 g/dL   Albumin 4.2 3.6 - 5.1 g/dL   Calcium 9.5 8.6 - 10.4 mg/dL   GFR, Est African American 61 >=60 mL/min   GFR, Est Non African American 53 (L) >=60 mL/min  CBC     Status: Abnormal   Collection Time: 10/26/15 11:02 AM  Result Value Ref Range   WBC 6.7 3.8 - 10.8 K/uL   RBC 4.30 3.80 - 5.10 MIL/uL   Hemoglobin 12.0 11.7 - 15.5 g/dL   HCT 37.6 35.0 - 45.0 %   MCV 87.4 80.0 - 100.0 fL   MCH 27.9 27.0 - 33.0 pg   MCHC 31.9 (L) 32.0 - 36.0 g/dL   RDW 15.2 (H) 11.0 - 15.0 %   Platelets 333 140 - 400 K/uL   MPV 8.3 7.5 - 12.5 fL    Comment: ** Please note change in unit of measure and reference range(s). **     Assessment & Plan  1. Rib pain on left side  - DG Ribs Unilateral Left; Future Use  Tylenol for pain at this  time. 2. Stress incontinence  - Ambulatory referral to Urology

## 2015-11-29 NOTE — Telephone Encounter (Signed)
Pt's daughter Krista Randall would like to discuss a medication the her mother can't tolerate, getting a referral to urology due to incontinence and a fall her mother had 3 weeks ago that she just found out about.  Her call back number is [phone]

## 2015-11-29 NOTE — Telephone Encounter (Signed)
appt scheduled for today

## 2015-12-06 ENCOUNTER — Ambulatory Visit
Admission: RE | Admit: 2015-12-06 | Discharge: 2015-12-06 | Disposition: A | Discharge status: Home / Self Care | Payer: Medicare Other | Source: Ambulatory Visit | Attending: Family Medicine | Admitting: Family Medicine

## 2015-12-06 ENCOUNTER — Ambulatory Visit (INDEPENDENT_AMBULATORY_CARE_PROVIDER_SITE_OTHER): Payer: Medicare Other | Admitting: Family Medicine

## 2015-12-06 ENCOUNTER — Encounter: Payer: Self-pay | Admitting: Family Medicine

## 2015-12-06 VITALS — BP 170/65 | HR 65 | Temp 98.3°F | Resp 16 | Wt 102.4 lb

## 2015-12-06 DIAGNOSIS — I700 Atherosclerosis of aorta: Secondary | ICD-10-CM | POA: Insufficient documentation

## 2015-12-06 DIAGNOSIS — J90 Pleural effusion, not elsewhere classified: Secondary | ICD-10-CM | POA: Diagnosis not present

## 2015-12-06 DIAGNOSIS — K449 Diaphragmatic hernia without obstruction or gangrene: Secondary | ICD-10-CM | POA: Insufficient documentation

## 2015-12-06 DIAGNOSIS — J449 Chronic obstructive pulmonary disease, unspecified: Secondary | ICD-10-CM | POA: Diagnosis not present

## 2015-12-06 DIAGNOSIS — R600 Localized edema: Secondary | ICD-10-CM | POA: Diagnosis not present

## 2015-12-06 DIAGNOSIS — I10 Essential (primary) hypertension: Secondary | ICD-10-CM | POA: Diagnosis not present

## 2015-12-06 DIAGNOSIS — N289 Disorder of kidney and ureter, unspecified: Secondary | ICD-10-CM | POA: Diagnosis not present

## 2015-12-06 MED ORDER — SPIRONOLACTONE 25 MG PO TABS: 25.0000 mg | ORAL_TABLET | Freq: Every day | ORAL | 6 refills | Status: DC

## 2015-12-06 MED ORDER — FUROSEMIDE 20 MG PO TABS: 20.0000 mg | ORAL_TABLET | Freq: Every day | ORAL | 6 refills | Status: DC

## 2015-12-06 NOTE — Progress Notes (Signed)
Name: Krista Randall   MRN: [id number]    DOB: [birth year]   Date:12/06/2015       Progress Note  Subjective  Chief Complaint  Chief Complaint  Patient presents with  . Hypertension    HPI Here for f/u of HBP and pleural effusion.  She is still SOB but has no cough or fever.  She does not note nausea with Spironolactone now.  No problem-specific Assessment & Plan notes found for this encounter.   Past Medical History:  Diagnosis Date  . Breast cancer ([place]) 2009   left breast  . Breast mass march 2016   rt lump @ [facility]   . Hernia, hiatal     Past Surgical History:  Procedure Laterality Date  . BREAST BIOPSY Right 1980's   negative  . CHOLECYSTECTOMY    . MASTECTOMY Left 2009    Family History  Problem Relation Age of Onset  . Colon cancer Mother 74  . Cancer Brother 53    lung  . Diabetes Mellitus I Daughter     Social History   Social History  . Marital status: Widowed    Spouse name: N/A  . Number of children: N/A  . Years of education: N/A   Occupational History  . retired    Social History Main Topics  . Smoking status: Never Smoker  . Smokeless tobacco: Never Used  . Alcohol use No  . Drug use: No  . Sexual activity: Not on file   Other Topics Concern  . Not on file   Social History Narrative   Lives alone     Current Outpatient Prescriptions:  .  amLODipine (NORVASC) 10 MG tablet, Take 1/2 tablet daily for BP, Disp: 30 tablet, Rfl: 6 .  carvedilol (COREG) 6.25 MG tablet, Take 1 tablet (6.25 mg total) by mouth 2 (two) times daily with a meal., Disp: 60 tablet, Rfl: 12 .  cloNIDine (CATAPRES) 0.3 MG tablet, Take 1 tablet (0.3 mg total) by mouth 3 (three) times daily., Disp: 90 tablet, Rfl: 6 .  ferrous sulfate 325 (65 FE) MG tablet, Take 325 mg by mouth daily with breakfast., Disp: , Rfl:  .  fluticasone (FLONASE) 50 MCG/ACT nasal spray, INSTILL 1 OR 2 SPRAYS IN EACH NOSTRIL DAILY., Disp: 16 g, Rfl: 12 .  latanoprost (XALATAN) 0.005 %  ophthalmic solution, , Disp: , Rfl: 4 .  levothyroxine (SYNTHROID, LEVOTHROID) 75 MCG tablet, Take 75 mcg by mouth daily., Disp: , Rfl: 11 .  lisinopril (PRINIVIL,ZESTRIL) 40 MG tablet, Take 1 tablet (40 mg total) by mouth daily., Disp: 90 tablet, Rfl: 3 .  pantoprazole (PROTONIX) 40 MG tablet, Take 40 mg by mouth daily., Disp: , Rfl:  .  Probiotic Product (DIGESTIVE ADVANTAGE PO), Take 1 tablet by mouth daily., Disp: , Rfl:  .  spironolactone (ALDACTONE) 25 MG tablet, Take 1 tablet (25 mg total) by mouth daily., Disp: 30 tablet, Rfl: 6 .  sucralfate (CARAFATE) 1 g tablet, , Disp: , Rfl: 5 .  tolterodine (DETROL LA) 2 MG 24 hr capsule, Take 1 capsule (2 mg total) by mouth daily., Disp: 30 capsule, Rfl: 6 .  furosemide (LASIX) 20 MG tablet, Take 1 tablet (20 mg total) by mouth daily., Disp: 30 tablet, Rfl: 6  Not on File   Review of Systems  Constitutional: Positive for malaise/fatigue and weight loss. Negative for chills and fever.  HENT: Negative for hearing loss.   Eyes: Negative for blurred vision and double vision.  Respiratory: Positive for shortness of breath. Negative for cough and wheezing.   Cardiovascular: Positive for chest pain. Negative for palpitations and leg swelling.  Gastrointestinal: Negative for abdominal pain, blood in stool and heartburn.  Genitourinary: Negative for dysuria, frequency and urgency.  Skin: Negative for rash.  Neurological: Positive for weakness. Negative for tremors and headaches.  Psychiatric/Behavioral: Negative for depression. The patient does not have insomnia.       Objective  Vitals:   12/06/15 1426 12/06/15 1509  BP: (!) 211/86 (!) 170/65  Pulse: 65   Resp: 16   Temp: 98.3 F (36.8 C)   TempSrc: Oral   Weight: 102 lb 6.4 oz (46.4 kg)     Physical Exam  Constitutional: She is oriented to person, place, and time and well-developed, well-nourished, and in no distress. No distress.  HENT:  Head: Normocephalic.  Neck: Normal range  of motion. Neck supple. Carotid bruit is not present. No thyromegaly present.  Cardiovascular: Normal rate, regular rhythm and normal heart sounds.  Exam reveals no gallop and no friction rub.   No murmur heard. Pulmonary/Chest: Effort normal and breath sounds normal. No respiratory distress. She has no wheezes. She has no rales.  Decreased breath sounds at both bases.  Musculoskeletal: She exhibits edema (2+ edema to knees).  Lymphadenopathy:    She has no cervical adenopathy.  Neurological: She is alert and oriented to person, place, and time.  Vitals reviewed.     Recent Results (from the past 2160 hour(s))  CBC with Differential     Status: Abnormal   Collection Time: 10/18/15  2:46 PM  Result Value Ref Range   WBC 5.7 3.8 - 10.8 K/uL   RBC 4.05 3.80 - 5.10 MIL/uL   Hemoglobin 11.4 (L) 11.7 - 15.5 g/dL   HCT 35.4 35.0 - 45.0 %   MCV 87.4 80.0 - 100.0 fL   MCH 28.1 27.0 - 33.0 pg   MCHC 32.2 32.0 - 36.0 g/dL   RDW 15.4 (H) 11.0 - 15.0 %   Platelets 282 140 - 400 K/uL   MPV 8.5 7.5 - 12.5 fL   Neutro Abs 3,819 1,500 - 7,800 cells/uL   Lymphs Abs 1,197 850 - 3,900 cells/uL   Monocytes Absolute 627 200 - 950 cells/uL   Eosinophils Absolute 57 15 - 500 cells/uL   Basophils Absolute 0 0 - 200 cells/uL   Neutrophils Relative % 67 %   Lymphocytes Relative 21 %   Monocytes Relative 11 %   Eosinophils Relative 1 %   Basophils Relative 0 %   Smear Review Criteria for review not met     Comment: ** Please note change in unit of measure and reference range(s). **  COMPLETE METABOLIC PANEL WITH GFR     Status: Abnormal   Collection Time: 10/18/15  2:46 PM  Result Value Ref Range   Sodium 143 135 - 146 mmol/L   Potassium 3.9 3.5 - 5.3 mmol/L   Chloride 105 98 - 110 mmol/L   CO2 27 20 - 31 mmol/L   Glucose, Bld 157 (H) 65 - 99 mg/dL   BUN 17 7 - 25 mg/dL   Creat 0.93 (H) 0.60 - 0.88 mg/dL    Comment:   For patients > or = 80 years of age: The upper reference limit  for Creatinine is approximately 13% higher for people identified as African-American.      Total Bilirubin 0.4 0.2 - 1.2 mg/dL   Alkaline Phosphatase 78 33 - 130  U/L   AST 18 10 - 35 U/L   ALT 12 6 - 29 U/L   Total Protein 6.4 6.1 - 8.1 g/dL   Albumin 4.2 3.6 - 5.1 g/dL   Calcium 9.5 8.6 - 10.4 mg/dL   GFR, Est African American 61 >=60 mL/min   GFR, Est Non African American 53 (L) >=60 mL/min  CBC     Status: Abnormal   Collection Time: 10/26/15 11:02 AM  Result Value Ref Range   WBC 6.7 3.8 - 10.8 K/uL   RBC 4.30 3.80 - 5.10 MIL/uL   Hemoglobin 12.0 11.7 - 15.5 g/dL   HCT 37.6 35.0 - 45.0 %   MCV 87.4 80.0 - 100.0 fL   MCH 27.9 27.0 - 33.0 pg   MCHC 31.9 (L) 32.0 - 36.0 g/dL   RDW 15.2 (H) 11.0 - 15.0 %   Platelets 333 140 - 400 K/uL   MPV 8.3 7.5 - 12.5 fL    Comment: ** Please note change in unit of measure and reference range(s). **     Assessment & Plan  Problem List Items Addressed This Visit      Cardiovascular and Mediastinum   Essential (primary) hypertension   Relevant Medications   spironolactone (ALDACTONE) 25 MG tablet   furosemide (LASIX) 20 MG tablet     Respiratory   Pleural effusion   Relevant Orders   DG Chest 2 View     Other   Pedal edema - Primary   Relevant Medications   furosemide (LASIX) 20 MG tablet   Other Relevant Orders   BASIC METABOLIC PANEL WITH GFR    Other Visit Diagnoses   None.     Meds ordered this encounter  Medications  . spironolactone (ALDACTONE) 25 MG tablet    Sig: Take 1 tablet (25 mg total) by mouth daily.    Dispense:  30 tablet    Refill:  6  . furosemide (LASIX) 20 MG tablet    Sig: Take 1 tablet (20 mg total) by mouth daily.    Dispense:  30 tablet    Refill:  6   1. Essential (primary) hypertension Cont other meds - spironolactone (ALDACTONE) 25 MG tablet; Take 1 tablet (25 mg total) by mouth daily.  Dispense: 30 tablet; Refill: 6  2. Pleural effusion  - DG Chest 2 View; Future  3. Pedal  edema  - furosemide (LASIX) 20 MG tablet; Take 1 tablet (20 mg total) by mouth daily.  Dispense: 30 tablet; Refill: 6 - BASIC METABOLIC PANEL WITH GFR

## 2015-12-07 ENCOUNTER — Telehealth: Payer: Self-pay | Admitting: *Deleted

## 2015-12-07 NOTE — Telephone Encounter (Signed)
We would need to do O2 in office at rest and with some exercise.  We could schecule that on Thurs or next week-jh

## 2015-12-07 NOTE — Telephone Encounter (Signed)
To be discussed at 8/21 visit.

## 2015-12-07 NOTE — Telephone Encounter (Signed)
Patient daughter would like to know if 02 would be considered for the shortness of breath.

## 2015-12-08 ENCOUNTER — Ambulatory Visit (INDEPENDENT_AMBULATORY_CARE_PROVIDER_SITE_OTHER): Payer: Medicare Other | Admitting: Urology

## 2015-12-08 ENCOUNTER — Encounter: Payer: Self-pay | Admitting: Urology

## 2015-12-08 VITALS — BP 194/80 | HR 109 | Ht <= 58 in | Wt 102.1 lb

## 2015-12-08 DIAGNOSIS — N393 Stress incontinence (female) (male): Secondary | ICD-10-CM | POA: Diagnosis not present

## 2015-12-08 LAB — BLADDER SCAN AMB NON-IMAGING: SCAN RESULT: 134

## 2015-12-08 NOTE — Progress Notes (Signed)
[id number] PM   Krista Randall [id number]  Referring provider: Arlis Porta., MD [place], [place] [address]  Chief Complaint  Patient presents with  . New Patient (Initial Visit)    Stress Incontinence Referred by Dr. Larene Beach    HPI: Patient is a 82 year old Caucasian female who is referred by her primary care physician, Dr. Luan Pulling, for stress urinary incontinence.  Patient states that she leaks urine all the time. She states the leakage occurs when she stands, coughs, laughs or sneezes. It occurs both night and day. She is going through 4-5 incontinence pads daily. She is currently on tolterodine without effect, but it is causing dry mouth out significantly.    She is also having associated urinary frequency and nocturia.  Her PVR is 134 mL.  She is not experiencing dysuria, gross hematuria or suprapubic pain. She does not have a history of recurrent urinary tract infections. She's not had recent fevers, chills, nausea or vomiting.  Patient does have hypertension and is on furosemide and spironolactone. She also suffers from pedal edema.  PMH: Past Medical History:  Diagnosis Date  . Arthritis   . Breast cancer ([facility] [place]) 2009   left breast  . Breast mass march 2016   rt lump @ [facility]   . Heartburn   . Hernia, hiatal   . Hypertension     Surgical History: Past Surgical History:  Procedure Laterality Date  . BREAST BIOPSY Right 1980's   negative  . CHOLECYSTECTOMY    . MASTECTOMY Left 2009    Home Medications:    Medication List       Accurate as of 12/08/15 11:59 PM. Always use your most recent med list.          amLODipine 10 MG tablet Commonly known as:  NORVASC Take 1/2 tablet daily for BP   carvedilol 6.25 MG tablet Commonly known as:  COREG Take 1 tablet (6.25 mg total) by mouth 2 (two) times daily with a meal.   cloNIDine 0.3 MG tablet Commonly known as:  CATAPRES Take 1 tablet (0.3 mg total) by mouth 3  (three) times daily.   DIGESTIVE ADVANTAGE PO Take 1 tablet by mouth daily.   ferrous sulfate 325 (65 FE) MG tablet Take 325 mg by mouth daily with breakfast.   fluticasone 50 MCG/ACT nasal spray Commonly known as:  FLONASE INSTILL 1 OR 2 SPRAYS IN EACH NOSTRIL DAILY.   furosemide 20 MG tablet Commonly known as:  LASIX Take 1 tablet (20 mg total) by mouth daily.   latanoprost 0.005 % ophthalmic solution Commonly known as:  XALATAN   levothyroxine 75 MCG tablet Commonly known as:  SYNTHROID, LEVOTHROID Take 75 mcg by mouth daily.   lisinopril 40 MG tablet Commonly known as:  PRINIVIL,ZESTRIL Take 1 tablet (40 mg total) by mouth daily.   pantoprazole 40 MG tablet Commonly known as:  PROTONIX Take 40 mg by mouth daily.   spironolactone 25 MG tablet Commonly known as:  ALDACTONE Take 1 tablet (25 mg total) by mouth daily.   sucralfate 1 g tablet Commonly known as:  CARAFATE   tolterodine 2 MG 24 hr capsule Commonly known as:  DETROL LA Take 1 capsule (2 mg total) by mouth daily.       Allergies: No Known Allergies  Family History: Family History  Problem Relation Age of Onset  . Colon cancer Mother 23  . Cancer Brother 10  lung  . Diabetes Mellitus I Daughter   . Kidney disease Neg Hx     Social History:  reports that she has never smoked. She has never used smokeless tobacco. She reports that she does not drink alcohol or use drugs.  ROS: UROLOGY Frequent Urination?: Yes Hard to postpone urination?: No Burning/pain with urination?: No Get up at night to urinate?: Yes Leakage of urine?: Yes Urine stream starts and stops?: No Trouble starting stream?: No Do you have to strain to urinate?: No Blood in urine?: No Urinary tract infection?: No Sexually transmitted disease?: No Injury to kidneys or bladder?: No Painful intercourse?: No Weak stream?: No Currently pregnant?: No Vaginal bleeding?: No Last menstrual period?:  n  Gastrointestinal Nausea?: Yes Vomiting?: No Indigestion/heartburn?: Yes Diarrhea?: No Constipation?: No  Constitutional Fever: No Night sweats?: No Weight loss?: No Fatigue?: No  Skin Skin rash/lesions?: No Itching?: No  Eyes Blurred vision?: No Double vision?: No  Ears/Nose/Throat Sore throat?: No Sinus problems?: No  Hematologic/Lymphatic Swollen glands?: No Easy bruising?: No  Cardiovascular Leg swelling?: Yes Chest pain?: No  Respiratory Cough?: No Shortness of breath?: Yes  Endocrine Excessive thirst?: No  Musculoskeletal Back pain?: Yes Joint pain?: No  Neurological Headaches?: No Dizziness?: Yes  Psychologic Depression?: No Anxiety?: No  Physical Exam: BP (!) 194/80   Pulse (!) 109   Ht 4\' 8"  (1.422 m)   Wt 102 lb 1.6 oz (46.3 kg)   BMI 22.89 kg/m   Constitutional: Well nourished. Alert and oriented, No acute distress. HEENT: Plain AT, moist mucus membranes. Trachea midline, no masses. Cardiovascular: No clubbing, cyanosis, or edema. Respiratory: Normal respiratory effort, no increased work of breathing. GI: Abdomen is soft, non tender, non distended, no abdominal masses. Liver and spleen not palpable.  No hernias appreciated.  Stool sample for occult testing is not indicated.   GU: No CVA tenderness.  No bladder fullness or masses.  Atrophic external genitalia, normal pubic hair distribution, no lesions.  Normal urethral meatus, no lesions, no prolapse, no discharge.   No urethral masses, tenderness and/or tenderness. No bladder fullness, tenderness or masses. Pale vagina mucosa, poor estrogen effect, no discharge, no lesions, good pelvic support, no cystocele or rectocele noted.  No cervical motion tenderness.  Uterus is freely mobile and non-fixed.  No adnexal/parametria masses or tenderness noted.  Anus and perineum are without rashes or lesions.    Skin: No rashes, bruises or suspicious lesions. Lymph: No cervical or inguinal  adenopathy. Neurologic: Grossly intact, no focal deficits, moving all 4 extremities. Psychiatric: Normal mood and affect.  Laboratory Data: Lab Results  Component Value Date   WBC 6.7 10/26/2015   HGB 12.0 10/26/2015   HCT 37.6 10/26/2015   MCV 87.4 10/26/2015   PLT 333 10/26/2015    Lab Results  Component Value Date   CREATININE 0.93 (H) 10/18/2015    Lab Results  Component Value Date   HGBA1C 6.1 08/24/2015    Lab Results  Component Value Date   TSH 0.848 12/23/2014    Lab Results  Component Value Date   AST 18 10/18/2015   Lab Results  Component Value Date   ALT 12 10/18/2015   Pertinent Imaging: Results for KISMET, REEDER (MRN [id number]) as of 12/11/2015 14:25  Ref. Range 12/08/2015 15:06  Scan Result Unknown 134    Assessment & Plan:    1. Stress incontinence  - reviewed behavioral therapies; bladder training, bladder control strategies and fluid management  - due to her age  and mobility issues, pelvic floor muscle training is not appropriate  - failed anticholinergic therapy  - beta-3 adrenoceptor agonist not appropriate due to HTN  - not an appropriate surgical candidate, so pelvic surgery, InterStim and Botox not good options  - is interested in PTNS  - BLADDER SCAN AMB NON-IMAGING  A face-to-face conversation for 45 minutes with patient and her daughter regarding her incontinence and appropriate treatments for her condition was conducted.  Greater than 50% was spent in counseling & coordination of care with the patient.   Return for RTC for PTNS.  These notes generated with voice recognition software. I apologize for typographical errors.  Zara Council, [facility] [address], [place], [place] [address] [phone]

## 2015-12-13 ENCOUNTER — Other Ambulatory Visit: Payer: Medicare Other

## 2015-12-14 LAB — BASIC METABOLIC PANEL WITH GFR
BUN: 22 mg/dL (ref 7–25)
CO2: 29 mmol/L (ref 20–31)
CREATININE: 1.48 mg/dL — AB (ref 0.60–0.88)
Calcium: 11.1 mg/dL — ABNORMAL HIGH (ref 8.6–10.4)
Chloride: 98 mmol/L (ref 98–110)
GFR, EST AFRICAN AMERICAN: 35 mL/min — AB (ref >=60)
GFR, Est Non African American: 30 mL/min — ABNORMAL LOW (ref >=60)
Glucose, Bld: 204 mg/dL — ABNORMAL HIGH (ref 65–99)
POTASSIUM: 3 mmol/L — AB (ref 3.5–5.3)
SODIUM: 144 mmol/L (ref 135–146)

## 2015-12-15 ENCOUNTER — Encounter: Payer: Self-pay | Admitting: Urology

## 2015-12-15 ENCOUNTER — Ambulatory Visit (INDEPENDENT_AMBULATORY_CARE_PROVIDER_SITE_OTHER): Payer: Medicare Other | Admitting: Urology

## 2015-12-15 VITALS — BP 202/75 | HR 92 | Ht <= 58 in | Wt 98.5 lb

## 2015-12-15 DIAGNOSIS — R32 Unspecified urinary incontinence: Secondary | ICD-10-CM | POA: Diagnosis not present

## 2015-12-15 LAB — PTNS-PERCUTANEOUS TIBIAL NERVE STIMULATION: Scan Result: 19

## 2015-12-15 MED ORDER — POTASSIUM CHLORIDE CRYS ER 20 MEQ PO TBCR: 20.0000 meq | EXTENDED_RELEASE_TABLET | Freq: Every day | ORAL | 3 refills | Status: DC

## 2015-12-15 NOTE — Progress Notes (Signed)
[id number] PM   Lurlean Horns [id number]  Referring provider: Arlis Porta., MD [place], [place] [address]  Chief Complaint  Patient presents with  . Urinary Incontinence    PTNS    HPI: Patient presents today for her 1st in a series of 12 PTNS for her urinary incontinence.  Background history Patient was referred by her primary care physician, Dr. Luan Pulling, for stress urinary incontinence.  Patient states that she leaks urine all the time. She states the leakage occurs when she stands, coughs, laughs or sneezes. It occurs both night and day. She is going through 4-5 incontinence pads daily. She is currently on tolterodine without effect, but it is causing dry mouth out significantly.  She is also having associated urinary frequency and nocturia.  Her PVR was 134 mL.  She is not experiencing dysuria, gross hematuria or suprapubic pain. She does not have a history of recurrent urinary tract infections. She's not had recent fevers, chills, nausea or vomiting.  Patient does have hypertension and is on furosemide and spironolactone. She also suffers from pedal edema.      Previous Therapy:     Behavioral Modification Techniques (discuss all modalities attempted with start/stop dates)            Pelvic Floor Exercises-NO           Biofeedback-NO           Bladder Training-NO           Fluid Management-on fluid restrictions           Dietary Restrictions-NO       Anticholinergics (discuss all tried with start/stop dates and the patient response)- patient did not want to continue with anticholinergics, as she has had intolerable dry mouth     Other previous therapy- not a candidate for Myrbetriq due to HTN  Today, she is complaining of 15 episodes of incontinence daily.  She is having mild urgency.  She denies dysuria, gross hematuria   BP (!) 202/75   Pulse 92   Ht 4\' 8"  (1.422 m)   Wt 98 lb 8 oz (44.7 kg)   BMI 22.08 kg/m   Contraindications  present for PTNS      Pacemaker-NO      Implantable defibrillator-NO      History of abnormal bleeding-NO      History of neuropathies or nerve damage-NO  Discussed with patient possible complications of procedure, such as discomfort, bleeding at insertion/stimulation site, procedure consent signed  Patient goals: Patient will like to see reduced frequency and no accidents.    PTNS treatment: The needle electrode was inserted into the lower right inner aspect of the patient's right leg. The surface electrode was placed on the inside arch of the foot on the treatment leg. The lead set was connected to the stimulator and the needle electrode clip was connected to the needle electrode. The stimulator that produces an adjustable electrical pulse that travels to sacral nerve plexus via the tibial nerve was increased to 19 until the patient received sensory response.    Treatment Plan:  Patient tolerated the procedure well. The electrode is removed without difficulty.  She will RTC in one week  For her 2nd  PTNS.        Return in about 1 week (around 12/22/2015) for # 2 PTNS.    These notes generated with voice recognition software. I apologize for typographical errors.  Casondra Gasca, PA-C  [facility] [address] [place],  [address] [phone]

## 2015-12-15 NOTE — Addendum Note (Signed)
Addended by: Devona Konig on: 12/15/2015 11:30 AM   Modules accepted: Orders

## 2015-12-16 ENCOUNTER — Ambulatory Visit: Payer: Medicare Other | Admitting: Urology

## 2015-12-20 ENCOUNTER — Other Ambulatory Visit: Payer: Self-pay | Admitting: Family Medicine

## 2015-12-20 ENCOUNTER — Encounter: Payer: Self-pay | Admitting: Family Medicine

## 2015-12-20 ENCOUNTER — Ambulatory Visit (INDEPENDENT_AMBULATORY_CARE_PROVIDER_SITE_OTHER): Payer: Medicare Other | Admitting: Family Medicine

## 2015-12-20 VITALS — BP 175/70 | HR 80 | Temp 97.9°F | Ht <= 58 in | Wt 94.0 lb

## 2015-12-20 DIAGNOSIS — I10 Essential (primary) hypertension: Secondary | ICD-10-CM | POA: Diagnosis not present

## 2015-12-20 DIAGNOSIS — R600 Localized edema: Secondary | ICD-10-CM

## 2015-12-20 DIAGNOSIS — K254 Chronic or unspecified gastric ulcer with hemorrhage: Secondary | ICD-10-CM | POA: Diagnosis not present

## 2015-12-20 DIAGNOSIS — I5020 Unspecified systolic (congestive) heart failure: Secondary | ICD-10-CM

## 2015-12-20 DIAGNOSIS — I509 Heart failure, unspecified: Secondary | ICD-10-CM | POA: Insufficient documentation

## 2015-12-20 LAB — CBC WITH DIFFERENTIAL/PLATELET
BASOS ABS: 0 {cells}/uL (ref 0–200)
BASOS PCT: 0 %
EOS ABS: 0 {cells}/uL — AB (ref 15–500)
Eosinophils Relative: 0 %
HEMATOCRIT: 38.1 % (ref 35.0–45.0)
Hemoglobin: 12.1 g/dL (ref 11.7–15.5)
LYMPHS PCT: 17 %
Lymphs Abs: 1326 cells/uL (ref 850–3900)
MCH: 27.4 pg (ref 27.0–33.0)
MCHC: 31.8 g/dL — ABNORMAL LOW (ref 32.0–36.0)
MCV: 86.4 fL (ref 80.0–100.0)
MONO ABS: 546 {cells}/uL (ref 200–950)
MONOS PCT: 7 %
MPV: 9 fL (ref 7.5–12.5)
Neutro Abs: 5928 cells/uL (ref 1500–7800)
Neutrophils Relative %: 76 %
PLATELETS: 336 10*3/uL (ref 140–400)
RBC: 4.41 MIL/uL (ref 3.80–5.10)
RDW: 15.8 % — AB (ref 11.0–15.0)
WBC: 7.8 10*3/uL (ref 3.8–10.8)

## 2015-12-20 NOTE — Progress Notes (Signed)
Name: Krista Randall   MRN: [id number]    DOB: 1920-09-30   Date:12/20/2015       Progress Note  Subjective  Chief Complaint  Chief Complaint  Patient presents with  . Follow-up    fluid on lungs    HPI Here for f/u of severe HBP.  She has more dizziness recently   Taking more meds.  Feels weaker.  Has appt with Dr. Nehemiah Massed ([facility]) in 3 days.  Shew still has no appetite and is still losing weight.  No problem-specific Assessment & Plan notes found for this encounter.   Past Medical History:  Diagnosis Date  . Arthritis   . Breast cancer ([facility]) 2009   left breast  . Breast mass march 2016   rt lump @ [facility]   . Heartburn   . Hernia, hiatal   . Hypertension     Past Surgical History:  Procedure Laterality Date  . BREAST BIOPSY Right 1980's   negative  . CHOLECYSTECTOMY    . MASTECTOMY Left 2009    Family History  Problem Relation Age of Onset  . Colon cancer Mother 60  . Cancer Brother 84    lung  . Diabetes Mellitus I Daughter   . Kidney disease Neg Hx     Social History   Social History  . Marital status: Widowed    Spouse name: N/A  . Number of children: N/A  . Years of education: N/A   Occupational History  . retired    Social History Main Topics  . Smoking status: Never Smoker  . Smokeless tobacco: Never Used  . Alcohol use No  . Drug use: No  . Sexual activity: Not on file   Other Topics Concern  . Not on file   Social History Narrative   Lives alone     Current Outpatient Prescriptions:  .  amLODipine (NORVASC) 10 MG tablet, Take 1/2 tablet daily for BP, Disp: 30 tablet, Rfl: 6 .  carvedilol (COREG) 6.25 MG tablet, Take 1 tablet (6.25 mg total) by mouth 2 (two) times daily with a meal., Disp: 60 tablet, Rfl: 12 .  cloNIDine (CATAPRES) 0.3 MG tablet, Take 1 tablet (0.3 mg total) by mouth 3 (three) times daily., Disp: 90 tablet, Rfl: 6 .  ferrous sulfate 325 (65 FE) MG tablet, Take 325 mg by mouth daily with breakfast., Disp: , Rfl:   .  fluticasone (FLONASE) 50 MCG/ACT nasal spray, INSTILL 1 OR 2 SPRAYS IN EACH NOSTRIL DAILY., Disp: 16 g, Rfl: 12 .  furosemide (LASIX) 20 MG tablet, Take 1 tablet (20 mg total) by mouth daily., Disp: 30 tablet, Rfl: 6 .  latanoprost (XALATAN) 0.005 % ophthalmic solution, , Disp: , Rfl: 4 .  levothyroxine (SYNTHROID, LEVOTHROID) 75 MCG tablet, Take 75 mcg by mouth daily., Disp: , Rfl: 11 .  lisinopril (PRINIVIL,ZESTRIL) 40 MG tablet, Take 1 tablet (40 mg total) by mouth daily., Disp: 90 tablet, Rfl: 3 .  pantoprazole (PROTONIX) 40 MG tablet, Take 40 mg by mouth daily., Disp: , Rfl:  .  potassium chloride SA (K-DUR,KLOR-CON) 20 MEQ tablet, Take 1 tablet (20 mEq total) by mouth daily. (Patient not taking: Reported on 12/15/2015), Disp: 30 tablet, Rfl: 3 .  Probiotic Product (DIGESTIVE ADVANTAGE PO), Take 1 tablet by mouth daily., Disp: , Rfl:  .  spironolactone (ALDACTONE) 25 MG tablet, Take 1 tablet (25 mg total) by mouth daily., Disp: 30 tablet, Rfl: 6 .  sucralfate (CARAFATE) 1 g tablet, ,  Disp: , Rfl: 5 .  tolterodine (DETROL LA) 2 MG 24 hr capsule, Take 1 capsule (2 mg total) by mouth daily. (Patient not taking: Reported on 12/20/2015), Disp: 30 capsule, Rfl: 6  Not on File   Review of Systems  Constitutional: Positive for malaise/fatigue and weight loss. Negative for chills and fever.  HENT: Negative for hearing loss.   Eyes: Negative for blurred vision and double vision.  Respiratory: Negative for cough, shortness of breath and wheezing.   Cardiovascular: Negative for chest pain, palpitations and leg swelling.  Gastrointestinal: Negative for abdominal pain, blood in stool and heartburn.  Genitourinary: Positive for frequency. Negative for dysuria and urgency.  Musculoskeletal: Positive for back pain and joint pain. Negative for myalgias.  Skin: Negative for rash.  Neurological: Positive for dizziness and weakness. Negative for tremors and headaches.      Objective  Vitals:    12/20/15 0918 12/20/15 1015  BP: (!) 186/98 (!) 175/70  Pulse: 80   Temp: 97.9 F (36.6 C)   TempSrc: Oral   Weight: 94 lb (42.6 kg)   Height: 4' 9"  (1.448 m)     Physical Exam  Constitutional: She is oriented to person, place, and time. No distress.  Thin,  Weaker in appearance  HENT:  Head: Normocephalic and atraumatic.  Eyes: Conjunctivae and EOM are normal. Pupils are equal, round, and reactive to light. No scleral icterus.  Neck: Normal range of motion. Neck supple. No thyromegaly present.  Cardiovascular: Normal rate and regular rhythm.  Exam reveals no gallop and no friction rub.   Murmur heard.  Systolic murmur is present with a grade of 3/6  Throughout.  CXR showed pleural effusion.  Pulmonary/Chest: Effort normal and breath sounds normal. No respiratory distress. She has no wheezes. She has no rales.  Abdominal: Soft. Bowel sounds are normal. She exhibits no distension and no mass. There is no tenderness.  Musculoskeletal: She exhibits edema.  1 bilateral pedal edema.  Lymphadenopathy:    She has no cervical adenopathy.  Neurological: She is alert and oriented to person, place, and time.  Vitals reviewed.      Recent Results (from the past 2160 hour(s))  CBC with Differential     Status: Abnormal   Collection Time: 10/18/15  2:46 PM  Result Value Ref Range   WBC 5.7 3.8 - 10.8 K/uL   RBC 4.05 3.80 - 5.10 MIL/uL   Hemoglobin 11.4 (L) 11.7 - 15.5 g/dL   HCT 35.4 35.0 - 45.0 %   MCV 87.4 80.0 - 100.0 fL   MCH 28.1 27.0 - 33.0 pg   MCHC 32.2 32.0 - 36.0 g/dL   RDW 15.4 (H) 11.0 - 15.0 %   Platelets 282 140 - 400 K/uL   MPV 8.5 7.5 - 12.5 fL   Neutro Abs 3,819 1,500 - 7,800 cells/uL   Lymphs Abs 1,197 850 - 3,900 cells/uL   Monocytes Absolute 627 200 - 950 cells/uL   Eosinophils Absolute 57 15 - 500 cells/uL   Basophils Absolute 0 0 - 200 cells/uL   Neutrophils Relative % 67 %   Lymphocytes Relative 21 %   Monocytes Relative 11 %   Eosinophils Relative 1  %   Basophils Relative 0 %   Smear Review Criteria for review not met     Comment: ** Please note change in unit of measure and reference range(s). **  COMPLETE METABOLIC PANEL WITH GFR     Status: Abnormal   Collection Time: 10/18/15  2:46  PM  Result Value Ref Range   Sodium 143 135 - 146 mmol/L   Potassium 3.9 3.5 - 5.3 mmol/L   Chloride 105 98 - 110 mmol/L   CO2 27 20 - 31 mmol/L   Glucose, Bld 157 (H) 65 - 99 mg/dL   BUN 17 7 - 25 mg/dL   Creat 0.93 (H) 0.60 - 0.88 mg/dL    Comment:   For patients > or = 80 years of age: The upper reference limit for Creatinine is approximately 13% higher for people identified as African-American.      Total Bilirubin 0.4 0.2 - 1.2 mg/dL   Alkaline Phosphatase 78 33 - 130 U/L   AST 18 10 - 35 U/L   ALT 12 6 - 29 U/L   Total Protein 6.4 6.1 - 8.1 g/dL   Albumin 4.2 3.6 - 5.1 g/dL   Calcium 9.5 8.6 - 10.4 mg/dL   GFR, Est African American 61 >=60 mL/min   GFR, Est Non African American 53 (L) >=60 mL/min  CBC     Status: Abnormal   Collection Time: 10/26/15 11:02 AM  Result Value Ref Range   WBC 6.7 3.8 - 10.8 K/uL   RBC 4.30 3.80 - 5.10 MIL/uL   Hemoglobin 12.0 11.7 - 15.5 g/dL   HCT 37.6 35.0 - 45.0 %   MCV 87.4 80.0 - 100.0 fL   MCH 27.9 27.0 - 33.0 pg   MCHC 31.9 (L) 32.0 - 36.0 g/dL   RDW 15.2 (H) 11.0 - 15.0 %   Platelets 333 140 - 400 K/uL   MPV 8.3 7.5 - 12.5 fL    Comment: ** Please note change in unit of measure and reference range(s). **  BLADDER SCAN AMB NON-IMAGING     Status: None   Collection Time: 12/08/15  3:06 PM  Result Value Ref Range   Scan Result 655   BASIC METABOLIC PANEL WITH GFR     Status: Abnormal   Collection Time: 12/13/15  8:54 AM  Result Value Ref Range   Sodium 144 135 - 146 mmol/L   Potassium 3.0 (L) 3.5 - 5.3 mmol/L   Chloride 98 98 - 110 mmol/L   CO2 29 20 - 31 mmol/L   Glucose, Bld 204 (H) 65 - 99 mg/dL   BUN 22 7 - 25 mg/dL   Creat 1.48 (H) 0.60 - 0.88 mg/dL    Comment:   For patients >  or = 80 years of age: The upper reference limit for Creatinine is approximately 13% higher for people identified as African-American.      Calcium 11.1 (H) 8.6 - 10.4 mg/dL   GFR, Est African American 35 (L) >=60 mL/min   GFR, Est Non African American 30 (L) >=60 mL/min  PTNS-Percutaneous Tibial Nerve Stimulati     Status: None   Collection Time: 12/15/15  3:29 PM  Result Value Ref Range   Scan Result 19      Assessment & Plan  Problem List Items Addressed This Visit      Cardiovascular and Mediastinum   Essential (primary) hypertension - Primary   Relevant Orders   BASIC METABOLIC PANEL WITH GFR   CHF (congestive heart failure) (HCC)     Digestive   GI bleeding   Relevant Orders   CBC with Differential     Other   Pedal edema    Other Visit Diagnoses   None.    1. Essential (primary) hypertension  - BASIC METABOLIC PANEL WITH  GFR Cont meds 2. Pedal edema   3. Systolic congestive heart failure, unspecified congestive heart failure chronicity ([place]) See Dr. Bea Laura later this week./  4. Gastrointestinal hemorrhage associated with gastric ulcer  - CBC with Differential No orders of the defined types were placed in this encounter.

## 2015-12-21 ENCOUNTER — Emergency Department: Payer: Medicare Other

## 2015-12-21 ENCOUNTER — Inpatient Hospital Stay
Admission: EM | Admit: 2015-12-21 | Discharge: 2015-12-24 | DRG: 542 | Disposition: A | Discharge status: Hospice | Payer: Medicare Other | Attending: Internal Medicine | Admitting: Internal Medicine

## 2015-12-21 DIAGNOSIS — Z853 Personal history of malignant neoplasm of breast: Secondary | ICD-10-CM

## 2015-12-21 DIAGNOSIS — Z515 Encounter for palliative care: Secondary | ICD-10-CM | POA: Diagnosis present

## 2015-12-21 DIAGNOSIS — Z79899 Other long term (current) drug therapy: Secondary | ICD-10-CM

## 2015-12-21 DIAGNOSIS — R600 Localized edema: Secondary | ICD-10-CM | POA: Diagnosis present

## 2015-12-21 DIAGNOSIS — Z66 Do not resuscitate: Secondary | ICD-10-CM | POA: Diagnosis present

## 2015-12-21 DIAGNOSIS — I110 Hypertensive heart disease with heart failure: Secondary | ICD-10-CM | POA: Diagnosis present

## 2015-12-21 DIAGNOSIS — I10 Essential (primary) hypertension: Secondary | ICD-10-CM

## 2015-12-21 DIAGNOSIS — I509 Heart failure, unspecified: Secondary | ICD-10-CM | POA: Diagnosis present

## 2015-12-21 DIAGNOSIS — E43 Unspecified severe protein-calorie malnutrition: Secondary | ICD-10-CM | POA: Diagnosis present

## 2015-12-21 DIAGNOSIS — Z833 Family history of diabetes mellitus: Secondary | ICD-10-CM

## 2015-12-21 DIAGNOSIS — N63 Unspecified lump in unspecified breast: Secondary | ICD-10-CM

## 2015-12-21 DIAGNOSIS — C7951 Secondary malignant neoplasm of bone: Principal | ICD-10-CM | POA: Diagnosis present

## 2015-12-21 DIAGNOSIS — E876 Hypokalemia: Secondary | ICD-10-CM | POA: Diagnosis present

## 2015-12-21 DIAGNOSIS — R609 Edema, unspecified: Secondary | ICD-10-CM

## 2015-12-21 DIAGNOSIS — Z7983 Long term (current) use of bisphosphonates: Secondary | ICD-10-CM

## 2015-12-21 DIAGNOSIS — Z6821 Body mass index (BMI) 21.0-21.9, adult: Secondary | ICD-10-CM

## 2015-12-21 DIAGNOSIS — R531 Weakness: Secondary | ICD-10-CM

## 2015-12-21 DIAGNOSIS — E875 Hyperkalemia: Secondary | ICD-10-CM | POA: Diagnosis present

## 2015-12-21 DIAGNOSIS — Z801 Family history of malignant neoplasm of trachea, bronchus and lung: Secondary | ICD-10-CM

## 2015-12-21 DIAGNOSIS — C50919 Malignant neoplasm of unspecified site of unspecified female breast: Secondary | ICD-10-CM

## 2015-12-21 DIAGNOSIS — Z800 Family history of malignant neoplasm of digestive organs: Secondary | ICD-10-CM

## 2015-12-21 DIAGNOSIS — R2681 Unsteadiness on feet: Secondary | ICD-10-CM

## 2015-12-21 DIAGNOSIS — I248 Other forms of acute ischemic heart disease: Secondary | ICD-10-CM | POA: Diagnosis present

## 2015-12-21 DIAGNOSIS — K219 Gastro-esophageal reflux disease without esophagitis: Secondary | ICD-10-CM | POA: Diagnosis present

## 2015-12-21 HISTORY — DX: Heart failure, unspecified: I50.9

## 2015-12-21 LAB — URINALYSIS COMPLETE WITH MICROSCOPIC (ARMC ONLY)
Bilirubin Urine: NEGATIVE
Glucose, UA: NEGATIVE mg/dL
Ketones, ur: NEGATIVE mg/dL
LEUKOCYTES UA: NEGATIVE
NITRITE: NEGATIVE
PROTEIN: 100 mg/dL — AB
SPECIFIC GRAVITY, URINE: 1.016 (ref 1.005–1.030)
Squamous Epithelial / LPF: NONE SEEN
pH: 5 (ref 5.0–8.0)

## 2015-12-21 LAB — BASIC METABOLIC PANEL WITH GFR
BUN: 17 mg/dL (ref 7–25)
CALCIUM: 14.1 mg/dL — AB (ref 8.6–10.4)
CHLORIDE: 98 mmol/L (ref 98–110)
CO2: 38 mmol/L — AB (ref 20–31)
CREATININE: 1.01 mg/dL — AB (ref 0.60–0.88)
GFR, Est African American: 55 mL/min — ABNORMAL LOW (ref >=60)
GFR, Est Non African American: 48 mL/min — ABNORMAL LOW (ref >=60)
GLUCOSE: 140 mg/dL — AB (ref 65–99)
Potassium: 2.9 mmol/L — ABNORMAL LOW (ref 3.5–5.3)
Sodium: 145 mmol/L (ref 135–146)

## 2015-12-21 LAB — CBC WITH DIFFERENTIAL/PLATELET
BASOS PCT: 1 %
Basophils Absolute: 0.1 10*3/uL (ref 0–0.1)
EOS ABS: 0 10*3/uL (ref 0–0.7)
EOS PCT: 0 %
HCT: 37.6 % (ref 35.0–47.0)
HEMOGLOBIN: 12.4 g/dL (ref 12.0–16.0)
Lymphocytes Relative: 21 %
Lymphs Abs: 1.4 10*3/uL (ref 1.0–3.6)
MCH: 28.4 pg (ref 26.0–34.0)
MCHC: 33.1 g/dL (ref 32.0–36.0)
MCV: 85.6 fL (ref 80.0–100.0)
MONOS PCT: 9 %
Monocytes Absolute: 0.6 10*3/uL (ref 0.2–0.9)
NEUTROS PCT: 69 %
Neutro Abs: 4.7 10*3/uL (ref 1.4–6.5)
PLATELETS: 278 10*3/uL (ref 150–440)
RBC: 4.39 MIL/uL (ref 3.80–5.20)
RDW: 15.7 % — AB (ref 11.5–14.5)
WBC: 6.8 10*3/uL (ref 3.6–11.0)

## 2015-12-21 LAB — COMPREHENSIVE METABOLIC PANEL
ALBUMIN: 3.7 g/dL (ref 3.5–5.0)
ALK PHOS: 159 U/L — AB (ref 38–126)
ALT: 30 U/L (ref 14–54)
ANION GAP: 10 (ref 5–15)
AST: 64 U/L — ABNORMAL HIGH (ref 15–41)
BUN: 23 mg/dL — ABNORMAL HIGH (ref 6–20)
CHLORIDE: 95 mmol/L — AB (ref 101–111)
CO2: 36 mmol/L — AB (ref 22–32)
Calcium: 14 mg/dL (ref 8.9–10.3)
Creatinine, Ser: 1.1 mg/dL — ABNORMAL HIGH (ref 0.44–1.00)
GFR calc non Af Amer: 42 mL/min — ABNORMAL LOW (ref >60)
GFR, EST AFRICAN AMERICAN: 49 mL/min — AB (ref >60)
GLUCOSE: 184 mg/dL — AB (ref 65–99)
POTASSIUM: 3.5 mmol/L (ref 3.5–5.1)
SODIUM: 141 mmol/L (ref 135–145)
Total Bilirubin: 0.9 mg/dL (ref 0.3–1.2)
Total Protein: 6.9 g/dL (ref 6.5–8.1)

## 2015-12-21 LAB — PHOSPHORUS: PHOSPHORUS: 3 mg/dL (ref 2.1–4.3)

## 2015-12-21 LAB — BRAIN NATRIURETIC PEPTIDE: B NATRIURETIC PEPTIDE 5: 226 pg/mL — AB (ref 0.0–100.0)

## 2015-12-21 LAB — TROPONIN I: Troponin I: 0.05 ng/mL (ref <0.03)

## 2015-12-21 LAB — TSH: TSH: 0.969 u[IU]/mL (ref 0.350–4.500)

## 2015-12-21 LAB — VITAMIN D 25 HYDROXY (VIT D DEFICIENCY, FRACTURES): Vit D, 25-Hydroxy: 33 ng/mL (ref 30–100)

## 2015-12-21 LAB — ALBUMIN: ALBUMIN: 3.7 g/dL (ref 3.6–5.1)

## 2015-12-21 MED ORDER — ACETAMINOPHEN 650 MG RE SUPP: 650.0000 mg | Freq: Four times a day (QID) | RECTAL | Status: DC | PRN

## 2015-12-21 MED ORDER — MAGNESIUM CITRATE PO SOLN: 1.0000 | Freq: Once | ORAL | Status: DC | PRN

## 2015-12-21 MED ORDER — SODIUM CHLORIDE 0.9% FLUSH
3.0000 mL | Freq: Two times a day (BID) | INTRAVENOUS | Status: DC
  Administered 2015-12-22 – 2015-12-24 (×6): 3 mL via INTRAVENOUS

## 2015-12-21 MED ORDER — ASPIRIN EC 81 MG PO TBEC
81.0000 mg | DELAYED_RELEASE_TABLET | Freq: Every day | ORAL | Status: DC
Start: 2015-12-21 — End: 2015-12-24
  Administered 2015-12-22 – 2015-12-24 (×4): 81 mg via ORAL
  Filled 2015-12-21 (×4): qty 1

## 2015-12-21 MED ORDER — ONDANSETRON HCL 4 MG/2ML IJ SOLN: 4.0000 mg | Freq: Four times a day (QID) | INTRAMUSCULAR | Status: DC | PRN

## 2015-12-21 MED ORDER — PANTOPRAZOLE SODIUM 40 MG PO TBEC
40.0000 mg | DELAYED_RELEASE_TABLET | Freq: Every day | ORAL | Status: DC
  Administered 2015-12-22 – 2015-12-24 (×3): 40 mg via ORAL
  Filled 2015-12-21 (×3): qty 1

## 2015-12-21 MED ORDER — RISAQUAD PO CAPS
1.0000 | ORAL_CAPSULE | Freq: Every day | ORAL | Status: DC
  Administered 2015-12-22 – 2015-12-24 (×3): 1 via ORAL
  Filled 2015-12-21 (×3): qty 1

## 2015-12-21 MED ORDER — AMLODIPINE BESYLATE 10 MG PO TABS
10.0000 mg | ORAL_TABLET | Freq: Every day | ORAL | Status: DC
  Administered 2015-12-22 – 2015-12-24 (×3): 10 mg via ORAL
  Filled 2015-12-21 (×3): qty 1

## 2015-12-21 MED ORDER — FERROUS SULFATE 325 (65 FE) MG PO TABS
325.0000 mg | ORAL_TABLET | Freq: Every day | ORAL | Status: DC
  Administered 2015-12-22 – 2015-12-24 (×3): 325 mg via ORAL
  Filled 2015-12-21 (×3): qty 1

## 2015-12-21 MED ORDER — POTASSIUM CHLORIDE CRYS ER 20 MEQ PO TBCR
20.0000 meq | EXTENDED_RELEASE_TABLET | Freq: Every day | ORAL | Status: DC
  Administered 2015-12-22: 08:00:00 20 meq via ORAL

## 2015-12-21 MED ORDER — ENOXAPARIN SODIUM 30 MG/0.3ML ~~LOC~~ SOLN
30.0000 mg | SUBCUTANEOUS | Status: DC
  Administered 2015-12-22 – 2015-12-23 (×2): 30 mg via SUBCUTANEOUS
  Filled 2015-12-21 (×2): qty 0.3

## 2015-12-21 MED ORDER — SENNOSIDES-DOCUSATE SODIUM 8.6-50 MG PO TABS
1.0000 | ORAL_TABLET | Freq: Every evening | ORAL | Status: DC | PRN
Start: 2015-12-21 — End: 2015-12-24

## 2015-12-21 MED ORDER — ATORVASTATIN CALCIUM 20 MG PO TABS
40.0000 mg | ORAL_TABLET | Freq: Once | ORAL | Status: DC
  Filled 2015-12-21: qty 2

## 2015-12-21 MED ORDER — LATANOPROST 0.005 % OP SOLN
1.0000 [drp] | Freq: Every day | OPHTHALMIC | Status: DC
  Administered 2015-12-22 – 2015-12-23 (×2): 1 [drp] via OPHTHALMIC
  Filled 2015-12-21: qty 2.5

## 2015-12-21 MED ORDER — SODIUM CHLORIDE 0.9 % IV BOLUS (SEPSIS)
250.0000 mL | Freq: Once | INTRAVENOUS | Status: AC
  Administered 2015-12-21: 250 mL via INTRAVENOUS

## 2015-12-21 MED ORDER — ENOXAPARIN SODIUM 40 MG/0.4ML ~~LOC~~ SOLN: 40.0000 mg | SUBCUTANEOUS | Status: DC

## 2015-12-21 MED ORDER — SUCRALFATE 1 G PO TABS
1.0000 g | ORAL_TABLET | Freq: Two times a day (BID) | ORAL | Status: DC
  Administered 2015-12-22 – 2015-12-24 (×6): 1 g via ORAL
  Filled 2015-12-21 (×6): qty 1

## 2015-12-21 MED ORDER — ACETAMINOPHEN 325 MG PO TABS
650.0000 mg | ORAL_TABLET | Freq: Four times a day (QID) | ORAL | Status: DC | PRN
  Administered 2015-12-23: 650 mg via ORAL
  Filled 2015-12-21: qty 2

## 2015-12-21 MED ORDER — HYDROCODONE-ACETAMINOPHEN 5-325 MG PO TABS
1.0000 | ORAL_TABLET | ORAL | Status: DC | PRN
  Administered 2015-12-22: 1 via ORAL
  Filled 2015-12-21: qty 1

## 2015-12-21 MED ORDER — FUROSEMIDE 20 MG PO TABS: 20.0000 mg | ORAL_TABLET | Freq: Every day | ORAL | Status: DC

## 2015-12-21 MED ORDER — FLUTICASONE PROPIONATE 50 MCG/ACT NA SUSP
1.0000 | Freq: Every day | NASAL | Status: DC
  Administered 2015-12-22 – 2015-12-24 (×3): 1 via NASAL
  Filled 2015-12-21: qty 16

## 2015-12-21 MED ORDER — SODIUM CHLORIDE 0.9 % IV SOLN
INTRAVENOUS | Status: DC
  Administered 2015-12-22: 01:00:00 via INTRAVENOUS

## 2015-12-21 MED ORDER — CARVEDILOL 6.25 MG PO TABS
6.2500 mg | ORAL_TABLET | Freq: Two times a day (BID) | ORAL | Status: DC
  Administered 2015-12-22 – 2015-12-24 (×6): 6.25 mg via ORAL
  Filled 2015-12-21 (×6): qty 1

## 2015-12-21 MED ORDER — BISACODYL 5 MG PO TBEC: 5.0000 mg | DELAYED_RELEASE_TABLET | Freq: Every day | ORAL | Status: DC | PRN

## 2015-12-21 MED ORDER — LISINOPRIL 20 MG PO TABS
40.0000 mg | ORAL_TABLET | Freq: Every day | ORAL | Status: DC
  Administered 2015-12-22 – 2015-12-23 (×2): 40 mg via ORAL
  Filled 2015-12-21 (×2): qty 2

## 2015-12-21 MED ORDER — LEVOTHYROXINE SODIUM 75 MCG PO TABS
75.0000 ug | ORAL_TABLET | Freq: Every day | ORAL | Status: DC
  Administered 2015-12-22 – 2015-12-24 (×3): 75 ug via ORAL
  Filled 2015-12-21 (×3): qty 1

## 2015-12-21 MED ORDER — ONDANSETRON HCL 4 MG PO TABS: 4.0000 mg | ORAL_TABLET | Freq: Four times a day (QID) | ORAL | Status: DC | PRN

## 2015-12-21 NOTE — ED Triage Notes (Signed)
Pt bib EMS from home w/ c/o weakness and decreased appetite.  Pt sts that she feels dizzy, has not had appetite x 2weeks.  Pt denies CP, SOB, n/v/d or falls.  Pt able to move all limbs on command, A/Ox4.  NAD.

## 2015-12-21 NOTE — ED Provider Notes (Signed)
[facility] Emergency Department Provider Note    First MD Initiated Contact with Patient 12/21/15 1858     (approximate)  I have reviewed the triage vital signs and the nursing notes.   HISTORY  Chief Complaint Weakness    HPI Krista Randall is a 82 y.o. female  presents with her 3 daughters due to concern for deterioration over the past 2 weeks. Daughter is a Sports coach at home has any collagen states that the patient has been having more confusion, unsteady gait and decreased oral intake over the past 2 weeks. States that she's lost roughly 4 pounds. States that the patient lives at home alone and has not been taking her medications as directed by her physician. States that she'll eat a piece of toast in the morning and drank some coffee but that's it. She does have a history of heart failure has been taking her diuretic pills as directed reportedly. Denies any shortness of breath or chest pain. Denies any nausea or vomiting. The patient states she otherwise feels well at this point and was brought here by her daughters. Daughters also report that this morning while patient's cleaning staff were in the house she began petting the staff like there were a pet dog.  The daughters are appropriately concerned as the patient has been driving herself to appointments.   Past Medical History:  Diagnosis Date  . Arthritis   . Breast cancer ([place]) 2009   left breast  . Breast mass march 2016   rt lump @ [place]   . Heartburn   . Hernia, hiatal   . Hypertension     Patient Active Problem List   Diagnosis Date Noted  . CHF (congestive heart failure) ([place]) 12/20/2015  . Pleural effusion 12/06/2015  . Rib pain on left side 11/29/2015  . Low weight 09/20/2015  . Incontinence in female 08/24/2015  . Stress incontinence 08/24/2015  . Allergic rhinitis due to pollen 07/26/2015  . Pedal edema 07/26/2015  . Local edema 07/26/2015  . GI bleeding 05/19/2015  .  Bleeding gastrointestinal 05/19/2015  . Awareness of heartbeats 01/22/2015  . Pre-diabetes 01/22/2015  . Need for influenza vaccination 01/22/2015  . Need for vaccination 01/22/2015  . Hypertensive pulmonary vascular disease ([place]) 01/15/2015  . Dizziness 01/15/2015  . MI (mitral incompetence) 01/15/2015  . TI (tricuspid incompetence) 01/15/2015  . Non-rheumatic mitral regurgitation 01/15/2015  . Secondary pulmonary hypertension ([place]) 01/15/2015  . Rheumatic tricuspid insufficiency 01/15/2015  . Disequilibrium 01/15/2015  . Benign essential HTN 12/31/2014  . Combined fat and carbohydrate induced hyperlipemia 12/31/2014  . Essential (primary) hypertension 12/31/2014  . Breast mass 06/30/2014  . Breast lump 06/30/2014    Past Surgical History:  Procedure Laterality Date  . BREAST BIOPSY Right 1980's   negative  . CHOLECYSTECTOMY    . MASTECTOMY Left 2009    Prior to Admission medications   Medication Sig Start Date End Date Taking? Authorizing Provider  amLODipine (NORVASC) 10 MG tablet Take 1/2 tablet daily for BP 09/07/15   Arlis Porta., MD  carvedilol (COREG) 6.25 MG tablet Take 1 tablet (6.25 mg total) by mouth 2 (two) times daily with a meal. 10/06/15   Arlis Porta., MD  cloNIDine (CATAPRES) 0.3 MG tablet Take 1 tablet (0.3 mg total) by mouth 3 (three) times daily. 10/26/15   Arlis Porta., MD  ferrous sulfate 325 (65 FE) MG tablet Take 325 mg by mouth daily with  breakfast.    Historical Provider, MD  fluticasone (FLONASE) 50 MCG/ACT nasal spray INSTILL 1 OR 2 SPRAYS IN EACH NOSTRIL DAILY. 06/22/15   Arlis Porta., MD  furosemide (LASIX) 20 MG tablet Take 1 tablet (20 mg total) by mouth daily. 12/06/15   Arlis Porta., MD  latanoprost (XALATAN) 0.005 % ophthalmic solution  07/30/15   Historical Provider, MD  levothyroxine (SYNTHROID, LEVOTHROID) 75 MCG tablet Take 75 mcg by mouth daily. 05/18/15   Historical Provider, MD  lisinopril (PRINIVIL,ZESTRIL) 40  MG tablet Take 1 tablet (40 mg total) by mouth daily. 09/20/15   Arlis Porta., MD  pantoprazole (PROTONIX) 40 MG tablet Take 40 mg by mouth daily.    Historical Provider, MD  potassium chloride SA (K-DUR,KLOR-CON) 20 MEQ tablet Take 1 tablet (20 mEq total) by mouth daily. Patient not taking: Reported on 12/15/2015 12/15/15   Arlis Porta., MD  Probiotic Product (DIGESTIVE ADVANTAGE PO) Take 1 tablet by mouth daily.    Historical Provider, MD  spironolactone (ALDACTONE) 25 MG tablet Take 1 tablet (25 mg total) by mouth daily. 12/06/15   Arlis Porta., MD  sucralfate (CARAFATE) 1 g tablet  08/18/15   Historical Provider, MD  tolterodine (DETROL LA) 2 MG 24 hr capsule Take 1 capsule (2 mg total) by mouth daily. Patient not taking: Reported on 12/20/2015 08/24/15   Arlis Porta., MD    Allergies Review of patient's allergies indicates not on file.  Family History  Problem Relation Age of Onset  . Colon cancer Mother 59  . Cancer Brother 47    lung  . Diabetes Mellitus I Daughter   . Kidney disease Neg Hx     Social History Social History  Substance Use Topics  . Smoking status: Never Smoker  . Smokeless tobacco: Never Used  . Alcohol use No    Review of Systems Patient denies headaches, rhinorrhea, blurry vision, numbness, shortness of breath, chest pain, edema, cough, abdominal pain, nausea, vomiting, diarrhea, dysuria, fevers, rashes or hallucinations unless otherwise stated above in HPI. ____________________________________________   PHYSICAL EXAM:  VITAL SIGNS: Vitals:   12/21/15 1915 12/21/15 1930  BP:  (!) 142/50  Pulse: 62 (!) 59  Resp: 19 20  Temp:      Constitutional: Alert and oriented. Frail elderly appearing, NAD Eyes: Conjunctivae are normal. PERRL. EOMI. Head: Atraumatic. Nose: No congestion/rhinnorhea. Mouth/Throat: Mucous membranes are moist.  Oropharynx non-erythematous. Neck: No stridor. Painless ROM. No cervical spine tenderness to  palpation Hematological/Lymphatic/Immunilogical: No cervical lymphadenopathy. Cardiovascular: Normal rate, regular rhythm. Grossly normal heart sounds.  Good peripheral circulation. Respiratory: Normal respiratory effort.  No retractions. Lungs CTAB. Gastrointestinal: Soft and nontender. No distention. No abdominal bruits. No CVA tenderness. Genitourinary:  Musculoskeletal: No lower extremity tenderness nor edema.  No joint effusions. Neurologic:  Normal speech and language. No gross focal neurologic deficits are appreciated. No gait instability. Skin:  Skin is warm, dry and intact. No rash noted. Psychiatric: Mood and affect are normal. Speech and behavior are normal.  ____________________________________________   LABS (all labs ordered are listed, but only abnormal results are displayed)  Results for orders placed or performed during the hospital encounter of 12/21/15 (from the past 24 hour(s))  CBC with Differential/Platelet     Status: Abnormal   Collection Time: 12/21/15  7:05 PM  Result Value Ref Range   WBC 6.8 3.6 - 11.0 K/uL   RBC 4.39 3.80 - 5.20 MIL/uL   Hemoglobin  12.4 12.0 - 16.0 g/dL   HCT 37.6 35.0 - 47.0 %   MCV 85.6 80.0 - 100.0 fL   MCH 28.4 26.0 - 34.0 pg   MCHC 33.1 32.0 - 36.0 g/dL   RDW 15.7 (H) 11.5 - 14.5 %   Platelets 278 150 - 440 K/uL   Neutrophils Relative % 69 %   Neutro Abs 4.7 1.4 - 6.5 K/uL   Lymphocytes Relative 21 %   Lymphs Abs 1.4 1.0 - 3.6 K/uL   Monocytes Relative 9 %   Monocytes Absolute 0.6 0.2 - 0.9 K/uL   Eosinophils Relative 0 %   Eosinophils Absolute 0.0 0 - 0.7 K/uL   Basophils Relative 1 %   Basophils Absolute 0.1 0 - 0.1 K/uL  Comprehensive metabolic panel     Status: Abnormal   Collection Time: 12/21/15  7:05 PM  Result Value Ref Range   Sodium 141 135 - 145 mmol/L   Potassium 3.5 3.5 - 5.1 mmol/L   Chloride 95 (L) 101 - 111 mmol/L   CO2 36 (H) 22 - 32 mmol/L   Glucose, Bld 184 (H) 65 - 99 mg/dL   BUN 23 (H) 6 - 20 mg/dL     Creatinine, Ser 1.10 (H) 0.44 - 1.00 mg/dL   Calcium 14.0 (HH) 8.9 - 10.3 mg/dL   Total Protein 6.9 6.5 - 8.1 g/dL   Albumin 3.7 3.5 - 5.0 g/dL   AST 64 (H) 15 - 41 U/L   ALT 30 14 - 54 U/L   Alkaline Phosphatase 159 (H) 38 - 126 U/L   Total Bilirubin 0.9 0.3 - 1.2 mg/dL   GFR calc non Af Amer 42 (L) >60 mL/min   GFR calc Af Amer 49 (L) >60 mL/min   Anion gap 10 5 - 15  Brain natriuretic peptide     Status: Abnormal   Collection Time: 12/21/15  7:05 PM  Result Value Ref Range   B Natriuretic Peptide 226.0 (H) 0.0 - 100.0 pg/mL  Urinalysis complete, with microscopic ([facility] only)     Status: Abnormal   Collection Time: 12/21/15  8:58 PM  Result Value Ref Range   Color, Urine YELLOW (A) YELLOW   APPearance CLOUDY (A) CLEAR   Glucose, UA NEGATIVE NEGATIVE mg/dL   Bilirubin Urine NEGATIVE NEGATIVE   Ketones, ur NEGATIVE NEGATIVE mg/dL   Specific Gravity, Urine 1.016 1.005 - 1.030   Hgb urine dipstick 3+ (A) NEGATIVE   pH 5.0 5.0 - 8.0   Protein, ur 100 (A) NEGATIVE mg/dL   Nitrite NEGATIVE NEGATIVE   Leukocytes, UA NEGATIVE NEGATIVE   RBC / HPF TOO NUMEROUS TO COUNT 0 - 5 RBC/hpf   WBC, UA 0-5 0 - 5 WBC/hpf   Bacteria, UA RARE (A) NONE SEEN   Squamous Epithelial / LPF NONE SEEN NONE SEEN   Hyaline Casts, UA PRESENT    ____________________________________________  EKG My review and personal interpretation at Time: 19:06   Indication: AMS  Rate: 60  Rhythm: Sinus Axis: normal Other: nonspecific ST changes. ____________________________________________  RADIOLOGY CT head with NAICA CXR my read shows no evidence of acute cardiopulmonary process.  ____________________________________________   PROCEDURES  Procedure(s) performed: none    Critical Care performed: yes CRITICAL CARE Performed by: Merlyn Lot   Total critical care time: 35 minutes  Critical care time was exclusive of separately billable procedures and treating other patients.  Critical care was  necessary to treat or prevent imminent or life-threatening deterioration.  Critical care was  time spent personally by me on the following activities: development of treatment plan with patient and/or surrogate as well as nursing, discussions with consultants, evaluation of patient's response to treatment, examination of patient, obtaining history from patient or surrogate, ordering and performing treatments and interventions, ordering and review of laboratory studies, ordering and review of radiographic studies, pulse oximetry and re-evaluation of patient's condition.  ____________________________________________   INITIAL IMPRESSION / ASSESSMENT AND PLAN / ED COURSE  Pertinent labs & imaging results that were available during my care of the patient were reviewed by me and considered in my medical decision making (see chart for details).  DDX: Electrolyte abnormality, dehydration, ACS, acute renal failure, subdural, failure to thrive  Krista Randall is a 82 y.o. who presents to the ED with confusion and difficulty ambulating with decreased food intake over the past 2 weeks. Patient arrives to the ER afebrile hemodynamic stable. Based on her complaints and will order laboratory evaluation as well as CT imaging of the head due to concern for CVA or subdural hematoma.    Clinical Course  Comment By Time  Patient with hypercalcemia noted on top of chronic kidney disease. Also with contraction metabolic alkalosis. BNP P mildly elevated.  Troponin still pending. UA without evidence of acute infection. CT imaging without acute intracranial abnormality but does show chronic atrophy and chronic ischemic changes.  Based on the patient's acute hypercalcemia I suspect that is the etiology of her confusion and change in mental status. We'll provide gentle hydration. Feel patient will require admission hospital for further evaluation and management.  Spoke with Dr. Ara Kussmaul who kindly agrees to admit  patient.  Have discussed with the patient and available family all diagnostics and treatments performed thus far and all questions were answered to the best of my ability. The patient demonstrates understanding and agreement with plan.  Merlyn Lot, MD 08/22 2137     ____________________________________________   FINAL CLINICAL IMPRESSION(S) / ED DIAGNOSES  Final diagnoses:  Hypercalcemia  Unsteady gait      NEW MEDICATIONS STARTED DURING THIS VISIT:  New Prescriptions   No medications on file     Note:  This document was prepared using Dragon voice recognition software and may include unintentional dictation errors.    Merlyn Lot, MD 12/21/15 2146

## 2015-12-22 ENCOUNTER — Observation Stay: Payer: Medicare Other

## 2015-12-22 ENCOUNTER — Encounter: Payer: Self-pay | Admitting: Radiology

## 2015-12-22 ENCOUNTER — Ambulatory Visit: Payer: Medicare Other | Admitting: Urology

## 2015-12-22 LAB — COMPREHENSIVE METABOLIC PANEL
ALT: 25 U/L (ref 14–54)
AST: 45 U/L — ABNORMAL HIGH (ref 15–41)
Albumin: 3.2 g/dL — ABNORMAL LOW (ref 3.5–5.0)
Alkaline Phosphatase: 132 U/L — ABNORMAL HIGH (ref 38–126)
Anion gap: 7 (ref 5–15)
BUN: 21 mg/dL — ABNORMAL HIGH (ref 6–20)
CHLORIDE: 99 mmol/L — AB (ref 101–111)
CO2: 36 mmol/L — ABNORMAL HIGH (ref 22–32)
Calcium: 13.3 mg/dL (ref 8.9–10.3)
Creatinine, Ser: 0.95 mg/dL (ref 0.44–1.00)
GFR, EST AFRICAN AMERICAN: 58 mL/min — AB (ref >60)
GFR, EST NON AFRICAN AMERICAN: 50 mL/min — AB (ref >60)
Glucose, Bld: 105 mg/dL — ABNORMAL HIGH (ref 65–99)
POTASSIUM: 2.5 mmol/L — AB (ref 3.5–5.1)
Sodium: 142 mmol/L (ref 135–145)
Total Bilirubin: 0.6 mg/dL (ref 0.3–1.2)
Total Protein: 5.7 g/dL — ABNORMAL LOW (ref 6.5–8.1)

## 2015-12-22 LAB — PHOSPHORUS: PHOSPHORUS: 3.1 mg/dL (ref 2.5–4.6)

## 2015-12-22 LAB — MAGNESIUM: MAGNESIUM: 1.4 mg/dL — AB (ref 1.7–2.4)

## 2015-12-22 LAB — TROPONIN I
TROPONIN I: 0.04 ng/mL — AB (ref <0.03)
TROPONIN I: 0.05 ng/mL — AB (ref <0.03)
Troponin I: 0.05 ng/mL (ref <0.03)

## 2015-12-22 LAB — BASIC METABOLIC PANEL
ANION GAP: 6 (ref 5–15)
BUN: 19 mg/dL (ref 6–20)
CALCIUM: 13.1 mg/dL — AB (ref 8.9–10.3)
CHLORIDE: 99 mmol/L — AB (ref 101–111)
CO2: 35 mmol/L — AB (ref 22–32)
Creatinine, Ser: 0.9 mg/dL (ref 0.44–1.00)
GFR calc non Af Amer: 53 mL/min — ABNORMAL LOW (ref >60)
Glucose, Bld: 166 mg/dL — ABNORMAL HIGH (ref 65–99)
Potassium: 3.7 mmol/L (ref 3.5–5.1)
Sodium: 140 mmol/L (ref 135–145)

## 2015-12-22 LAB — CBC
HCT: 35 % (ref 35.0–47.0)
Hemoglobin: 11.8 g/dL — ABNORMAL LOW (ref 12.0–16.0)
MCH: 29 pg (ref 26.0–34.0)
MCHC: 33.7 g/dL (ref 32.0–36.0)
MCV: 86.1 fL (ref 80.0–100.0)
PLATELETS: 225 10*3/uL (ref 150–440)
RBC: 4.07 MIL/uL (ref 3.80–5.20)
RDW: 15.7 % — AB (ref 11.5–14.5)
WBC: 6.1 10*3/uL (ref 3.6–11.0)

## 2015-12-22 LAB — THYROID PANEL WITH TSH
Free Thyroxine Index: 3.1 (ref 1.4–3.8)
T3 UPTAKE: 34 % (ref 22–35)
T4, Total: 9.1 ug/dL (ref 4.5–12.0)
TSH: 1.86 m[IU]/L

## 2015-12-22 MED ORDER — MAGNESIUM OXIDE 400 (241.3 MG) MG PO TABS
400.0000 mg | ORAL_TABLET | Freq: Two times a day (BID) | ORAL | Status: AC
  Administered 2015-12-22 (×2): 400 mg via ORAL
  Filled 2015-12-22 (×2): qty 1

## 2015-12-22 MED ORDER — CETYLPYRIDINIUM CHLORIDE 0.05 % MT LIQD
7.0000 mL | Freq: Two times a day (BID) | OROMUCOSAL | Status: DC
  Administered 2015-12-22 – 2015-12-24 (×4): 7 mL via OROMUCOSAL

## 2015-12-22 MED ORDER — MAGNESIUM SULFATE 4 GM/100ML IV SOLN
4.0000 g | Freq: Once | INTRAVENOUS | Status: AC
  Administered 2015-12-22: 4 g via INTRAVENOUS
  Filled 2015-12-22: qty 100

## 2015-12-22 MED ORDER — POTASSIUM CHLORIDE 10 MEQ/100ML IV SOLN
10.0000 meq | INTRAVENOUS | Status: DC
  Administered 2015-12-22: 10 meq via INTRAVENOUS
  Filled 2015-12-22 (×4): qty 100

## 2015-12-22 MED ORDER — TECHNETIUM TC 99M MEDRONATE IV KIT
25.0000 | PACK | Freq: Once | INTRAVENOUS | Status: AC | PRN
  Administered 2015-12-22: 22.955 via INTRAVENOUS

## 2015-12-22 MED ORDER — POTASSIUM CHLORIDE CRYS ER 20 MEQ PO TBCR
40.0000 meq | EXTENDED_RELEASE_TABLET | Freq: Once | ORAL | Status: AC
  Administered 2015-12-22: 40 meq via ORAL
  Filled 2015-12-22: qty 2

## 2015-12-22 MED ORDER — POTASSIUM CHLORIDE CRYS ER 20 MEQ PO TBCR
40.0000 meq | EXTENDED_RELEASE_TABLET | ORAL | Status: AC
  Administered 2015-12-22 – 2015-12-23 (×4): 40 meq via ORAL
  Filled 2015-12-22 (×5): qty 2

## 2015-12-22 MED ORDER — ZOLEDRONIC ACID 4 MG/5ML IV CONC
4.0000 mg | Freq: Once | INTRAVENOUS | Status: AC
  Administered 2015-12-22: 08:00:00 4 mg via INTRAVENOUS
  Filled 2015-12-22: qty 5

## 2015-12-22 MED ORDER — CLONIDINE HCL 0.3 MG/24HR TD PTWK
0.3000 mg | MEDICATED_PATCH | TRANSDERMAL | Status: DC
  Administered 2015-12-22: 08:00:00 0.3 mg via TRANSDERMAL
  Filled 2015-12-22: qty 1

## 2015-12-22 MED ORDER — POTASSIUM CHLORIDE IN NACL 20-0.9 MEQ/L-% IV SOLN
INTRAVENOUS | Status: DC
  Administered 2015-12-22: 10:00:00 via INTRAVENOUS
  Filled 2015-12-22 (×3): qty 1000

## 2015-12-22 NOTE — Care Management (Signed)
Admitted to [facility] with the diagnosis of hypercalcemia under observation status. Lives alone. Daughter is Ward Ammirati [phone] or [phone]). Last seen Dr. Luan Pulling on Monday. No home Health. No skilled facility. Uses no oxygen in the home. No falls. Decreased appetite with the weight loss of 4 lbs x 1 week. Takes care of all basic and instrumental activities of daily living herself, drives. [employer] in the home. Family will transport. Shelbie Ammons RN MSN CCM Care Management [phone]

## 2015-12-22 NOTE — Care Management Obs Status (Signed)
[place] NOTIFICATION   Patient Details  Name: Krista Randall MRN: [id number] Date of Birth: 1921-02-04   Medicare Observation Status Notification Given:  Yes    Shelbie Ammons, RN 12/22/2015, 12:13 PM

## 2015-12-22 NOTE — Progress Notes (Signed)
Notified RN Jinny Blossom that lab called about critical calcium level.

## 2015-12-22 NOTE — Consult Note (Signed)
Patient unavailable for consultation was actively getting her bone scan. Given patient's hypercalcemia and results of bone scan, this is highly suggestive of widely metastatic breast cancer. Have ordered a CA-27-29 for completeness. Given her advanced age, treatment options are limited. If desired, could restart letrozole. No further intervention is needed. Full consult to follow. If patient is discharged prior to evaluation and patient is not enrolled in hospice, she can follow-up in the [place] as an outpatient.

## 2015-12-22 NOTE — Progress Notes (Signed)
Initial Nutrition Assessment  DOCUMENTATION CODES:   Severe malnutrition in context of chronic illness  INTERVENTION:  -RD to order Snacks -Magic cup TID with meals, each supplement provides 290 kcal and 9 grams of protein -Encourage PO intake, intake of supplements  NUTRITION DIAGNOSIS:   Malnutrition related to chronic illness as evidenced by severe depletion of muscle mass, severe depletion of body fat, percent weight loss.  GOAL:   Patient will meet greater than or equal to 90% of their needs  MONITOR:   PO intake, I & O's, Labs, Weight trends  REASON FOR ASSESSMENT:   Malnutrition Screening Tool    ASSESSMENT:   Krista Randall is a 82 y.o. female with a known history of Left breast cancer, hypertension, GERD and arthritis was in a usual state of health until the past 2 weeks when the daughters report gradual decline with symptoms including decreased by mouth intake, decreased appetite, unsteady gait, per progressive fatigue and confusion  Spoke with Krista Randall, family at bedside. They endorse her not eating much at home for about 2 weeks, in addition to a 4# wt loss during that time span. Per chart review pt exhibits 6#/5.8% severe wt loss in 2 weeks. This morning, pt consumed some toast and fruit, but only ate about half of her meal, her daughter said. Pt complains of taste alterations, Daughter states "they have tried everything." Encouraged pt to consume as much food as she could; also complains of poor appetite. Nutrition-Focused physical exam completed. Findings are severe fat depletion, severe muscle depletion, and mild edema.  Pt does not like boost or ensure,  Was agreeable to trying magic cup and fruit plates and bread in between meals.  Labs and medications reviewed: K 2.5, Mg 1.4 --> caused by hypercalcemia Iron, Mg, KCL replacement NaCl with KCl 31mEq @ 32mL/hr  Diet Order:  Diet Heart Room service appropriate? Yes; Fluid consistency: Thin  Skin:  Reviewed, no  issues  Last BM:  PTA  Height:   Ht Readings from Last 1 Encounters:  12/21/15 4\' 8"  (1.422 m)    Weight:   Wt Readings from Last 1 Encounters:  12/21/15 96 lb 8 oz (43.8 kg)    Ideal Body Weight:  36.36 kg  BMI:  Body mass index is 21.63 kg/m.  Estimated Nutritional Needs:   Kcal:  1000-1300 calories  Protein:  45-52 grams  Fluid:  >/= 1L  EDUCATION NEEDS:   Education needs addressed  Satira Anis. Aqil Goetting, MS, RD LDN Inpatient Clinical Dietitian Pager [phone]

## 2015-12-22 NOTE — Progress Notes (Addendum)
[place] [facility] [place] NAME: Krista Randall    MR#:  [id number]  DATE OF BIRTH:  1920/10/17  SUBJECTIVE:  CHIEF COMPLAINT:   Chief Complaint  Patient presents with  . Weakness  Patient is 82 year old Caucasian female with past medical history significant for history of breast carcinoma, gastroesophageal reflux disease, essential hypertension, who presents to the hospital with generalized weakness, decreased oral intake, decreased appetite, unsteady gait, confusion. On arrival to the hospital, she was noted to be hyperglycemic. She complains of generalized bone pains. Both scan revealed widespread metastasis . Patient received Zometa for hypercalcemia, she is being continued on IV fluids. Her lower extremities are swollen  Review of Systems  Unable to perform ROS: Medical condition    VITAL SIGNS: Blood pressure (!) 176/62, pulse 74, temperature 98.4 F (36.9 C), temperature source Oral, resp. rate 18, height 4\' 8"  (1.422 m), weight 43.8 kg (96 lb 8 oz), SpO2 95 %.  PHYSICAL EXAMINATION:   GENERAL:  82 y.o.-year-old patient lying in the bed with no acute distress. Sitting in the bed, complains of back pains EYES: Pupils equal, round, reactive to light and accommodation. No scleral icterus. Extraocular muscles intact.  HEENT: Head atraumatic, normocephalic. Oropharynx and nasopharynx clear.  NECK:  Supple, no jugular venous distention. No thyroid enlargement, no tenderness.  LUNGS: Normal breath sounds bilaterally, no wheezing, rales,rhonchi or crepitation. No use of accessory muscles of respiration.  CARDIOVASCULAR: S1, S2 normal. No murmurs, rubs, or gallops.  ABDOMEN: Soft, nontender, nondistended. Bowel sounds present. No organomegaly or mass.  EXTREMITIES: 2+ lower extremity and pedal edema, no cyanosis, or clubbing.  NEUROLOGIC: Cranial nerves II through XII are intact. Muscle strength 5/5 in all extremities. Sensation intact. Gait not  checked.  PSYCHIATRIC: The patient is alert and oriented x 3.  SKIN: No obvious rash, lesion, or ulcer.   ORDERS/RESULTS REVIEWED:   CBC  Recent Labs Lab 12/20/15 1035 12/21/15 1905 12/22/15 0445  WBC 7.8 6.8 6.1  HGB 12.1 12.4 11.8*  HCT 38.1 37.6 35.0  PLT 336 278 225  MCV 86.4 85.6 86.1  MCH 27.4 28.4 29.0  MCHC 31.8* 33.1 33.7  RDW 15.8* 15.7* 15.7*  LYMPHSABS 1,326 1.4  --   MONOABS 546 0.6  --   EOSABS 0* 0.0  --   BASOSABS 0 0.1  --    ------------------------------------------------------------------------------------------------------------------  Chemistries   Recent Labs Lab 12/20/15 1035 12/21/15 1905 12/22/15 0445  NA 145 141 142  K 2.9* 3.5 2.5*  CL 98 95* 99*  CO2 38* 36* 36*  GLUCOSE 140* 184* 105*  BUN 17 23* 21*  CREATININE 1.01* 1.10* 0.95  CALCIUM 14.1* 14.0* 13.3*  MG  --   --  1.4*  AST  --  64* 45*  ALT  --  30 25  ALKPHOS  --  159* 132*  BILITOT  --  0.9 0.6   ------------------------------------------------------------------------------------------------------------------ estimated creatinine clearance is 23 mL/min (by C-G formula based on SCr of 0.95 mg/dL). ------------------------------------------------------------------------------------------------------------------  Recent Labs  12/20/15 1035 12/21/15 2228  TSH 1.86 0.969  T4TOTAL 9.1  --     Cardiac Enzymes  Recent Labs Lab 12/22/15 0047 12/22/15 0445 12/22/15 1118  TROPONINI 0.05* 0.04* 0.05*   ------------------------------------------------------------------------------------------------------------------ Invalid input(s): POCBNP ---------------------------------------------------------------------------------------------------------------  RADIOLOGY: Dg Chest 2 View  Result Date: 12/21/2015 CLINICAL DATA:  Fatigue and weakness.  History of CHF. EXAM: CHEST  2 VIEW COMPARISON:  12/06/2015 FINDINGS: Small pleural effusions, greater on the right.  These were  also seen previously. There is a background of hyperinflation. Chronic cardiomegaly. Large hiatal hernia with gaseous distension less than previous. IMPRESSION: 1. Small pleural effusions which were also seen previously. Cardiomegaly without pulmonary edema. 2. Large hiatal hernia. Electronically Signed   By: Monte Fantasia M.D.   On: 12/21/2015 20:36   Ct Head Wo Contrast  Result Date: 12/21/2015 CLINICAL DATA:  Weakness, decreased appetite EXAM: CT HEAD WITHOUT CONTRAST TECHNIQUE: Contiguous axial images were obtained from the base of the skull through the vertex without intravenous contrast. COMPARISON:  None. FINDINGS: Brain: No evidence of acute infarction, hemorrhage, hydrocephalus, extra-axial collection or mass lesion/mass effect. Vascular: No hyperdense vessel or unexpected calcification. Skull: No evidence of calvarial fracture. Sinuses/Orbits: The visualized paranasal sinuses are essentially clear. The mastoid air cells are unopacified. Other: Age related atrophy.  No ventriculomegaly. Subcortical white matter and periventricular small vessel ischemic changes. Intracranial atherosclerosis. IMPRESSION: No evidence of acute intracranial abnormality. Atrophy with small vessel ischemic changes. Electronically Signed   By: Julian Hy M.D.   On: 12/21/2015 20:40   Nm Bone Scan Whole Body  Result Date: 12/22/2015 CLINICAL DATA:  Breast cancer.  Hypercalcemia. EXAM: NUCLEAR MEDICINE WHOLE BODY BONE SCAN TECHNIQUE: Whole body anterior and posterior images were obtained approximately 3 hours after intravenous injection of radiopharmaceutical. RADIOPHARMACEUTICALS:  22.96 mCi Technetium-98m MDP IV COMPARISON:  10/25/2009 FINDINGS: There multifocal areas of abnormal bony uptake in the ribs bilaterally, right greater than left. Abnormal bony uptake within the thoracic spine and lower lumbar spine. Subtle focus of increased activity noted in the left lower femoral shaft. IMPRESSION: Areas of abnormal  bony uptake within the ribs bilaterally, thoracic and lumbar spine, and left femur. Findings concerning for metastatic involvement. Electronically Signed   By: Rolm Baptise M.D.   On: 12/22/2015 14:24    EKG:  Orders placed or performed during the hospital encounter of 12/21/15  . EKG 12-Lead  . EKG 12-Lead    ASSESSMENT AND PLAN:  Active Problems:   Hypercalcemia #1. Hypercalcemia of malignancy, status post Zometa 12/22/2015, continue IV fluids, discontinue IV fluids if calcium level improves by tomorrow,  both scan is concerning for widespread bony metastases, appreciate oncologist's input, get palliative care involved #2. Hypokalemia, supplementing intravenously, magnesium level was checked, was found to be low, supplementing intravenously, check magnesium and potassium tomorrow #3. Elevated troponin, likely demand ischemia, supportive therapy #4. Metastatic generalized body pains, supportive therapy #5. Lower extremity swelling, bilateral, unclear etiology, get Doppler ultrasound to rule out DVT. Will initiate Lasix tomorrow morning as long as kidney function is good  Management plans discussed with the patient, family and they are in agreement.   DRUG ALLERGIES: Not on File  CODE STATUS:     Code Status Orders        Start     Ordered   12/22/15 0047  Do not attempt resuscitation (DNR)  Continuous    Question Answer Comment  In the event of cardiac or respiratory ARREST Do not call a "code blue"   In the event of cardiac or respiratory ARREST Do not perform Intubation, CPR, defibrillation or ACLS   In the event of cardiac or respiratory ARREST Use medication by any route, position, wound care, and other measures to relive pain and suffering. May use oxygen, suction and manual treatment of airway obstruction as needed for comfort.      12/22/15 0047    Code Status History    Date Active Date Inactive Code Status  Order ID Comments User Context   12/21/2015 11:38 PM  12/22/2015 12:47 AM Full Code [id number]  Harvie Bridge, DO Inpatient   05/20/2015  1:59 AM 05/21/2015  7:19 PM Full Code [id number]  Saundra Shelling, MD ED    Advance Directive Documentation   Flowsheet Row Most Recent Value  Type of Advance Directive  Healthcare Power of Attorney  Pre-existing out of facility DNR order (yellow form or pink MOST form)  No data  "MOST" Form in Place?  No data      TOTAL TIME TAKING CARE OF THIS PATIENT: 40 minutes.    Theodoro Grist M.D on 12/22/2015 at 3:01 PM  Between 7am to 6pm - Pager - [phone]  After 6pm go to [email or web address] - password [email or web address] [facility]  Office  [phone]  CC: Primary care physician; Dicky Doe, MD

## 2015-12-22 NOTE — H&P (Signed)
[place] @ [facility] Admission History and Physical Krista Randall, D.O.  ---------------------------------------------------------------------------------------------------------------------   PATIENT NAME: Krista Randall MR#: [id number] DATE OF BIRTH: 04/05/1921 DATE OF ADMISSION: 12/21/2015 PRIMARY CARE PHYSICIAN: Krista Doe, MD  REQUESTING/REFERRING PHYSICIAN: ED Dr. Quentin Randall  CHIEF COMPLAINT: Chief Complaint  Patient presents with  . Weakness    HISTORY OF PRESENT ILLNESS: Krista Randall is a 82 y.o. female with a known history of Left breast cancer, hypertension, GERD and arthritis was in a usual state of health until the past 2 weeks when the daughters report gradual decline with symptoms including decreased by mouth intake, decreased appetite, unsteady gait, per progressive fatigue and confusion. Today she was acutely confused and began petting a caregiver leg as if it were a dog. Patient's daughters report that she has improved somewhat since she arrived in the emergency department but she is still not at her baseline.  Patient's daughter states that she will live at home alone but her daughters check in on her. She may not be taking her medications as instructed. And she has certainly had a decrease in her by mouth intake resulting in a weight loss of 4 pounds over the past week.    Patient states that she feels fine, denies all complaints however her mental status is altered.  There has been no recent illness, travel or sick contacts.    EMS/ED COURSE:   Patient received IV fluid hydration.  PAST MEDICAL HISTORY: Past Medical History:  Diagnosis Date  . Arthritis   . Breast cancer ([place]) 2009   left breast  . Breast mass march 2016   rt lump @ [place]   . CHF (congestive heart failure) ([place])   . Heartburn   . Hernia, hiatal   . Hypertension       PAST SURGICAL HISTORY: Past Surgical History:  Procedure Laterality Date  . BREAST BIOPSY Right  1980's   negative  . CHOLECYSTECTOMY    . MASTECTOMY Left 2009      SOCIAL HISTORY: Social History  Substance Use Topics  . Smoking status: Never Smoker  . Smokeless tobacco: Never Used  . Alcohol use No      FAMILY HISTORY: Family History  Problem Relation Age of Onset  . Colon cancer Mother 37  . Cancer Brother 35    lung  . Diabetes Mellitus I Daughter   . Kidney disease Neg Hx      MEDICATIONS AT HOME: Prior to Admission medications   Medication Sig Start Date End Date Taking? Authorizing Provider  amLODipine (NORVASC) 10 MG tablet Take 1/2 tablet daily for BP 09/07/15  Yes Arlis Porta., MD  carvedilol (COREG) 6.25 MG tablet Take 1 tablet (6.25 mg total) by mouth 2 (two) times daily with a meal. 10/06/15  Yes Arlis Porta., MD  estradiol (ESTRACE) 0.1 MG/GM vaginal cream Place 1 Applicatorful vaginally at bedtime.   Yes Krista A McGowan, PA-C  ferrous sulfate 325 (65 FE) MG tablet Take 325 mg by mouth daily with breakfast.   Yes Historical Provider, MD  fluticasone (FLONASE) 50 MCG/ACT nasal spray INSTILL 1 OR 2 SPRAYS IN EACH NOSTRIL DAILY. 06/22/15  Yes Arlis Porta., MD  furosemide (LASIX) 20 MG tablet Take 1 tablet (20 mg total) by mouth daily. 12/06/15  Yes Arlis Porta., MD  latanoprost Ivin Poot) 0.005 % ophthalmic solution  07/30/15  Yes Historical Provider, MD  levothyroxine (SYNTHROID, LEVOTHROID) 75 MCG tablet Take  75 mcg by mouth daily. 05/18/15  Yes Historical Provider, MD  lisinopril (PRINIVIL,ZESTRIL) 40 MG tablet Take 1 tablet (40 mg total) by mouth daily. 09/20/15  Yes Arlis Porta., MD  pantoprazole (PROTONIX) 40 MG tablet Take 40 mg by mouth daily.   Yes Historical Provider, MD  potassium chloride SA (K-DUR,KLOR-CON) 20 MEQ tablet Take 1 tablet (20 mEq total) by mouth daily. 12/15/15  Yes Arlis Porta., MD  Probiotic Product (DIGESTIVE ADVANTAGE PO) Take 1 tablet by mouth daily.   Yes Historical Provider, MD  sucralfate  (CARAFATE) 1 g tablet  08/18/15  Yes Historical Provider, MD  cloNIDine (CATAPRES) 0.3 MG tablet Take 1 tablet (0.3 mg total) by mouth 3 (three) times daily. Patient not taking: Reported on 12/21/2015 10/26/15   Arlis Porta., MD  spironolactone (ALDACTONE) 25 MG tablet Take 1 tablet (25 mg total) by mouth daily. Patient not taking: Reported on 12/21/2015 12/06/15   Arlis Porta., MD  tolterodine (DETROL LA) 2 MG 24 hr capsule Take 1 capsule (2 mg total) by mouth daily. Patient not taking: Reported on 12/20/2015 08/24/15   Arlis Porta., MD      DRUG ALLERGIES: Not on File   REVIEW OF SYSTEMS: Unable to assess secondary to altered mental status.  PHYSICAL EXAMINATION: VITAL SIGNS: Blood pressure (!) 164/64, pulse 71, temperature 98.1 F (36.7 C), temperature source Oral, resp. rate 20, height 4\' 8"  (1.422 m), weight 43.8 kg (96 lb 8 oz), SpO2 95 %.  GENERAL: 82 y.o.-year-old white female patient, well-developed, well-nourished lying in the bed in no acute distress.  Pleasant and cooperative.   HEENT: Head atraumatic, normocephalic. Pupils equal, round, reactive to light and accommodation. No scleral icterus. Extraocular muscles intact. Nares are patent. Oropharynx is clear. Mucus membranes moist. NECK: Supple, full range of motion. No JVD, no bruit heard. No thyroid enlargement, no tenderness, no cervical lymphadenopathy. CHEST: Normal breath sounds bilaterally. No wheezing, rales, rhonchi or crackles. No use of accessory muscles of respiration.  No reproducible chest wall tenderness.  CARDIOVASCULAR: S1, S2 normal. No murmurs, rubs, or gallops. Cap refill <2 seconds. ABDOMEN: Soft, nontender, nondistended. No rebound, guarding, rigidity. Normoactive bowel sounds present in all four quadrants. No organomegaly or mass. EXTREMITIES: Full range of motion. No pedal edema, cyanosis, or clubbing. NEUROLOGIC: Cranial nerves II through XII are grossly intact with no focal sensorimotor  deficit. Muscle strength 5/5 in all extremities. Sensation intact. Gait not checked. PSYCHIATRIC: The patient is alert and oriented x 3. Normal affect, mood, thought content. SKIN: Warm, dry, and intact without obvious rash, lesion, or ulcer.  LABORATORY PANEL:  CBC  Recent Labs Lab 12/21/15 1905  WBC 6.8  HGB 12.4  HCT 37.6  PLT 278   ----------------------------------------------------------------------------------------------------------------- Chemistries  Recent Labs Lab 12/21/15 1905  NA 141  K 3.5  CL 95*  CO2 36*  GLUCOSE 184*  BUN 23*  CREATININE 1.10*  CALCIUM 14.0*  AST 64*  ALT 30  ALKPHOS 159*  BILITOT 0.9   ------------------------------------------------------------------------------------------------------------------ Cardiac Enzymes  Recent Labs Lab 12/21/15 1905  TROPONINI 0.05*   ------------------------------------------------------------------------------------------------------------------  RADIOLOGY: Dg Chest 2 View  Result Date: 12/21/2015 CLINICAL DATA:  Fatigue and weakness.  History of CHF. EXAM: CHEST  2 VIEW COMPARISON:  12/06/2015 FINDINGS: Small pleural effusions, greater on the right. These were also seen previously. There is a background of hyperinflation. Chronic cardiomegaly. Large hiatal hernia with gaseous distension less than previous. IMPRESSION: 1. Small pleural effusions  which were also seen previously. Cardiomegaly without pulmonary edema. 2. Large hiatal hernia. Electronically Signed   By: Monte Fantasia M.D.   On: 12/21/2015 20:36   Ct Head Wo Contrast  Result Date: 12/21/2015 CLINICAL DATA:  Weakness, decreased appetite EXAM: CT HEAD WITHOUT CONTRAST TECHNIQUE: Contiguous axial images were obtained from the base of the skull through the vertex without intravenous contrast. COMPARISON:  None. FINDINGS: Brain: No evidence of acute infarction, hemorrhage, hydrocephalus, extra-axial collection or mass lesion/mass effect.  Vascular: No hyperdense vessel or unexpected calcification. Skull: No evidence of calvarial fracture. Sinuses/Orbits: The visualized paranasal sinuses are essentially clear. The mastoid air cells are unopacified. Other: Age related atrophy.  No ventriculomegaly. Subcortical white matter and periventricular small vessel ischemic changes. Intracranial atherosclerosis. IMPRESSION: No evidence of acute intracranial abnormality. Atrophy with small vessel ischemic changes. Electronically Signed   By: Julian Hy M.D.   On: 12/21/2015 20:40    EKG: Sinus at 60 bpm with nonspecific ST and T wave changes  IMPRESSION AND PLAN:  This is a 82 y.o. female with medical history significant for hypertension, GERD, arthritis and left breast cancer as well as chronic kidney disease now being admitted with: 1. Hypercalcemia-likely the cause of her alteration in mental status recently. Differential diagnosis includes medication effect, dehydration, endocrine dysfunction and malignancy. We will admit with telemetry monitoring, IV fluid hydration. We will check an intact PTH, phosphorus and a vitamin D level. We'll recheck her BMP in the morning. 2. Elevated troponin at 0.05-we will trend her troponins, check lipids and TSH as well. 3. AK I-patient will receive IV fluid hydration and recheck BMP in the morning.   Diet/Nutrition: Heart healthy Fluids: IV normal saline DVT Px: Lovenox, SCDs and early ambulation Code Status: DNR/DNI patient's CODE STATUS was discussed with the patient and her daughter Krista Randall who is her healthcare proxy. Patient stated that she would not want any resuscitative measures such as CPR or intubation.  All the records are reviewed and case discussed with ED provider. Management plans discussed with the patient and/or family who express understanding and agree with plan of care.   TOTAL TIME TAKING CARE OF THIS PATIENT: 60 minutes.   Jerod Mcquain D.O. on 12/22/2015 at 12:33  AM Between 7am to 6pm - Pager - [phone] After 6pm go to [email or web address] - Proofreader [employer] [place] [facility] Office [phone] CC: Primary care physician; Krista Doe, MD     Note: This dictation was prepared with Dragon dictation along with smaller phrase technology. Any transcriptional errors that result from this process are unintentional.

## 2015-12-23 DIAGNOSIS — Z515 Encounter for palliative care: Secondary | ICD-10-CM | POA: Diagnosis present

## 2015-12-23 DIAGNOSIS — E43 Unspecified severe protein-calorie malnutrition: Secondary | ICD-10-CM | POA: Diagnosis present

## 2015-12-23 DIAGNOSIS — Z66 Do not resuscitate: Secondary | ICD-10-CM

## 2015-12-23 DIAGNOSIS — M1990 Unspecified osteoarthritis, unspecified site: Secondary | ICD-10-CM | POA: Diagnosis not present

## 2015-12-23 DIAGNOSIS — Z800 Family history of malignant neoplasm of digestive organs: Secondary | ICD-10-CM | POA: Diagnosis not present

## 2015-12-23 DIAGNOSIS — R600 Localized edema: Secondary | ICD-10-CM | POA: Diagnosis present

## 2015-12-23 DIAGNOSIS — Z79899 Other long term (current) drug therapy: Secondary | ICD-10-CM | POA: Diagnosis not present

## 2015-12-23 DIAGNOSIS — R410 Disorientation, unspecified: Secondary | ICD-10-CM | POA: Diagnosis not present

## 2015-12-23 DIAGNOSIS — Z833 Family history of diabetes mellitus: Secondary | ICD-10-CM | POA: Diagnosis not present

## 2015-12-23 DIAGNOSIS — R531 Weakness: Secondary | ICD-10-CM

## 2015-12-23 DIAGNOSIS — C7951 Secondary malignant neoplasm of bone: Secondary | ICD-10-CM | POA: Diagnosis present

## 2015-12-23 DIAGNOSIS — I110 Hypertensive heart disease with heart failure: Secondary | ICD-10-CM | POA: Diagnosis present

## 2015-12-23 DIAGNOSIS — K219 Gastro-esophageal reflux disease without esophagitis: Secondary | ICD-10-CM | POA: Diagnosis present

## 2015-12-23 DIAGNOSIS — C50919 Malignant neoplasm of unspecified site of unspecified female breast: Secondary | ICD-10-CM

## 2015-12-23 DIAGNOSIS — Z789 Other specified health status: Secondary | ICD-10-CM

## 2015-12-23 DIAGNOSIS — Z9012 Acquired absence of left breast and nipple: Secondary | ICD-10-CM | POA: Diagnosis not present

## 2015-12-23 DIAGNOSIS — I10 Essential (primary) hypertension: Secondary | ICD-10-CM | POA: Diagnosis not present

## 2015-12-23 DIAGNOSIS — E876 Hypokalemia: Secondary | ICD-10-CM | POA: Diagnosis present

## 2015-12-23 DIAGNOSIS — N63 Unspecified lump in breast: Secondary | ICD-10-CM | POA: Diagnosis not present

## 2015-12-23 DIAGNOSIS — I509 Heart failure, unspecified: Secondary | ICD-10-CM | POA: Diagnosis present

## 2015-12-23 DIAGNOSIS — E875 Hyperkalemia: Secondary | ICD-10-CM | POA: Diagnosis present

## 2015-12-23 DIAGNOSIS — Z6821 Body mass index (BMI) 21.0-21.9, adult: Secondary | ICD-10-CM | POA: Diagnosis not present

## 2015-12-23 DIAGNOSIS — R978 Other abnormal tumor markers: Secondary | ICD-10-CM | POA: Diagnosis not present

## 2015-12-23 DIAGNOSIS — Z853 Personal history of malignant neoplasm of breast: Secondary | ICD-10-CM | POA: Diagnosis not present

## 2015-12-23 DIAGNOSIS — C801 Malignant (primary) neoplasm, unspecified: Secondary | ICD-10-CM | POA: Diagnosis not present

## 2015-12-23 DIAGNOSIS — Z7983 Long term (current) use of bisphosphonates: Secondary | ICD-10-CM | POA: Diagnosis not present

## 2015-12-23 DIAGNOSIS — I248 Other forms of acute ischemic heart disease: Secondary | ICD-10-CM | POA: Diagnosis present

## 2015-12-23 DIAGNOSIS — Z801 Family history of malignant neoplasm of trachea, bronchus and lung: Secondary | ICD-10-CM | POA: Diagnosis not present

## 2015-12-23 LAB — COMPREHENSIVE METABOLIC PANEL
ALBUMIN: 3.3 g/dL — AB (ref 3.5–5.0)
ALK PHOS: 147 U/L — AB (ref 38–126)
ALT: 27 U/L (ref 14–54)
ANION GAP: 6 (ref 5–15)
AST: 46 U/L — ABNORMAL HIGH (ref 15–41)
BUN: 20 mg/dL (ref 6–20)
CALCIUM: 12.7 mg/dL — AB (ref 8.9–10.3)
CHLORIDE: 108 mmol/L (ref 101–111)
CO2: 31 mmol/L (ref 22–32)
Creatinine, Ser: 0.85 mg/dL (ref 0.44–1.00)
GFR calc Af Amer: >60 mL/min (ref >60)
GFR calc non Af Amer: 57 mL/min — ABNORMAL LOW (ref >60)
GLUCOSE: 127 mg/dL — AB (ref 65–99)
Potassium: 6.4 mmol/L (ref 3.5–5.1)
SODIUM: 145 mmol/L (ref 135–145)
Total Bilirubin: 0.4 mg/dL (ref 0.3–1.2)
Total Protein: 6.1 g/dL — ABNORMAL LOW (ref 6.5–8.1)

## 2015-12-23 LAB — POTASSIUM
POTASSIUM: 4.7 mmol/L (ref 3.5–5.1)
Potassium: 6.3 mmol/L (ref 3.5–5.1)
Potassium: 5.2 mmol/L — ABNORMAL HIGH (ref 3.5–5.1)

## 2015-12-23 LAB — CANCER ANTIGEN 27.29: CA 27.29: 1048.1 U/mL — AB (ref 0.0–38.6)

## 2015-12-23 LAB — PARATHYROID HORMONE, INTACT (NO CA): PTH: 26 pg/mL (ref 15–65)

## 2015-12-23 MED ORDER — SODIUM POLYSTYRENE SULFONATE 15 GM/60ML PO SUSP: 30.0000 g | Freq: Once | ORAL | Status: DC

## 2015-12-23 MED ORDER — LETROZOLE 2.5 MG PO TABS
2.5000 mg | ORAL_TABLET | Freq: Every day | ORAL | Status: DC
  Administered 2015-12-23 – 2015-12-24 (×2): 2.5 mg via ORAL
  Filled 2015-12-23 (×2): qty 1

## 2015-12-23 MED ORDER — FUROSEMIDE 20 MG PO TABS
20.0000 mg | ORAL_TABLET | Freq: Every day | ORAL | Status: DC
  Administered 2015-12-23 – 2015-12-24 (×2): 20 mg via ORAL
  Filled 2015-12-23 (×2): qty 1

## 2015-12-23 MED ORDER — NYSTATIN 100000 UNIT/GM EX POWD
Freq: Three times a day (TID) | CUTANEOUS | Status: DC
  Administered 2015-12-23 – 2015-12-24 (×5): via TOPICAL
  Filled 2015-12-23: qty 15

## 2015-12-23 MED ORDER — SODIUM POLYSTYRENE SULFONATE 15 GM/60ML PO SUSP
30.0000 g | Freq: Once | ORAL | Status: AC
Start: 2015-12-23 — End: 2015-12-23
  Administered 2015-12-23: 11:00:00 30 g via ORAL
  Filled 2015-12-23: qty 120

## 2015-12-23 MED ORDER — DIPHENHYDRAMINE HCL 50 MG/ML IJ SOLN
12.5000 mg | Freq: Once | INTRAMUSCULAR | Status: AC
  Administered 2015-12-23: 12.5 mg via INTRAVENOUS
  Filled 2015-12-23: qty 1

## 2015-12-23 NOTE — Progress Notes (Signed)
Critical potassium reported to Dr. Marcille Blanco.  Stop fluids with potassium and he will reorder labs

## 2015-12-23 NOTE — Consult Note (Signed)
Consultation Note Date: 12/23/2015   Patient Name: Krista Randall  DOB: 1921/03/06  MRN: [id number]  Age / Sex: 82 y.o., female  PCP: Arlis Porta., MD Referring Physician: Vaughan Basta, MD  Reason for Consultation: Establishing goals of care and Psychosocial/spiritual support  HPI/Patient Profile: 82 y.o. female   admitted on 12/21/2015 with with a known history of Left breast cancer, hypertension, GERD and arthritis was in a usual state of health until the past 2 weeks when the daughters report gradual decline with symptoms including decreased by mouth intake, decreased appetite, unsteady gait, per progressive fatigue and confusion.   Admitted through the ER with altered mental status, currently elevated potassium being treated with Kayexelate.  Elevated hypercalcemia and bone scan results highly suggestive of widely metastatic breat cancer.  She remains intermittently  confused   Patient lives alone with family checking on her.  Family is faced with advanced directive decisions and anticipatory care needs.    Clinical Assessment and Goals of Care:   This NP Wadie Lessen reviewed medical records, received report from team, assessed the patient and then meet at the patient's bedside along with two daughters and SILs  to discuss diagnosis, prognosis, GOC, EOL wishes disposition and options.  A detailed discussion was had today regarding advanced directives.  Concepts specific to code status, artifical feeding and hydration, continued IV antibiotics and rehospitalization was had.  The difference between a aggressive medical intervention path  and a palliative comfort care path for this patient at this time was had.  Values and goals of care important to patient and family were attempted to be elicited.  MOST form introduced  Concept of Hospice and Palliative Care were discussed  Natural  trajectory and expectations at EOL were discussed.  Questions and concerns addressed.   Family encouraged to call with questions or concerns.  PMT will continue to support holistically.   SUMMARY OF RECOMMENDATIONS    Code Status/Advance Care Planning:  DNR   Palliative Prophylaxis:   Bowel Regimen, Delirium Protocol, Frequent Pain Assessment and Oral Care  Additional Recommendations (Limitations, Scope, Preferences):  Family is currently processing amongst themselves the current situation and making decisions regarding desire for life prolonging interventions vs a more comfort care approach.  Psycho-social/Spiritual:   Desire for further Chaplaincy support:no  Additional Recommendations: Education on Hospice  Prognosis:   < 6 months  Discharge Planning: Leaning toward home with hospice vs home health To Be Determined      Primary Diagnoses: Present on Admission: . Hypercalcemia   I have reviewed the medical record, interviewed the patient and family, and examined the patient. The following aspects are pertinent.  Past Medical History:  Diagnosis Date  . Arthritis   . Breast cancer ([place]) 2009   left breast  . Breast mass march 2016   rt lump @ [place]   . CHF (congestive heart failure) ([place])   . Heartburn   . Hernia, hiatal   . Hypertension    Social History  Social History  . Marital status: Widowed    Spouse name: N/A  . Number of children: N/A  . Years of education: N/A   Occupational History  . retired    Social History Main Topics  . Smoking status: Never Smoker  . Smokeless tobacco: Never Used  . Alcohol use No  . Drug use: No  . Sexual activity: Not Asked   Other Topics Concern  . None   Social History Narrative   Lives alone   Family History  Problem Relation Age of Onset  . Colon cancer Mother 79  . Cancer Brother 29    lung  . Diabetes Mellitus I Daughter   . Kidney disease Neg Hx    Scheduled Meds: . acidophilus  1  capsule Oral Daily  . amLODipine  10 mg Oral Daily  . antiseptic oral rinse  7 mL Mouth Rinse BID  . aspirin EC  81 mg Oral Daily  . atorvastatin  40 mg Oral Once  . carvedilol  6.25 mg Oral BID WC  . cloNIDine  0.3 mg Transdermal Weekly  . enoxaparin (LOVENOX) injection  30 mg Subcutaneous Q24H  . ferrous sulfate  325 mg Oral Q breakfast  . fluticasone  1 spray Each Nare Daily  . latanoprost  1 drop Both Eyes QHS  . levothyroxine  75 mcg Oral QAC breakfast  . lisinopril  40 mg Oral Daily  . nystatin   Topical TID  . pantoprazole  40 mg Oral Daily  . sodium chloride flush  3 mL Intravenous Q12H  . sucralfate  1 g Oral BID   Continuous Infusions:  PRN Meds:.acetaminophen **OR** acetaminophen, bisacodyl, HYDROcodone-acetaminophen, magnesium citrate, ondansetron **OR** ondansetron (ZOFRAN) IV, senna-docusate Medications Prior to Admission:  Prior to Admission medications   Medication Sig Start Date End Date Taking? Authorizing Provider  amLODipine (NORVASC) 10 MG tablet Take 1/2 tablet daily for BP 09/07/15  Yes Arlis Porta., MD  carvedilol (COREG) 6.25 MG tablet Take 1 tablet (6.25 mg total) by mouth 2 (two) times daily with a meal. 10/06/15  Yes Arlis Porta., MD  estradiol (ESTRACE) 0.1 MG/GM vaginal cream Place 1 Applicatorful vaginally at bedtime.   Yes Shannon A McGowan, PA-C  ferrous sulfate 325 (65 FE) MG tablet Take 325 mg by mouth daily with breakfast.   Yes Historical Provider, MD  fluticasone (FLONASE) 50 MCG/ACT nasal spray INSTILL 1 OR 2 SPRAYS IN EACH NOSTRIL DAILY. 06/22/15  Yes Arlis Porta., MD  furosemide (LASIX) 20 MG tablet Take 1 tablet (20 mg total) by mouth daily. 12/06/15  Yes Arlis Porta., MD  latanoprost (XALATAN) 0.005 % ophthalmic solution  07/30/15  Yes Historical Provider, MD  levothyroxine (SYNTHROID, LEVOTHROID) 75 MCG tablet Take 75 mcg by mouth daily. 05/18/15  Yes Historical Provider, MD  lisinopril (PRINIVIL,ZESTRIL) 40 MG tablet Take  1 tablet (40 mg total) by mouth daily. 09/20/15  Yes Arlis Porta., MD  pantoprazole (PROTONIX) 40 MG tablet Take 40 mg by mouth daily.   Yes Historical Provider, MD  potassium chloride SA (K-DUR,KLOR-CON) 20 MEQ tablet Take 1 tablet (20 mEq total) by mouth daily. 12/15/15  Yes Arlis Porta., MD  Probiotic Product (DIGESTIVE ADVANTAGE PO) Take 1 tablet by mouth daily.   Yes Historical Provider, MD  sucralfate (CARAFATE) 1 g tablet  08/18/15  Yes Historical Provider, MD  cloNIDine (CATAPRES) 0.3 MG tablet Take 1 tablet (0.3 mg total) by mouth 3 (three)  times daily. Patient not taking: Reported on 12/21/2015 10/26/15   Arlis Porta., MD  spironolactone (ALDACTONE) 25 MG tablet Take 1 tablet (25 mg total) by mouth daily. Patient not taking: Reported on 12/21/2015 12/06/15   Arlis Porta., MD  tolterodine (DETROL LA) 2 MG 24 hr capsule Take 1 capsule (2 mg total) by mouth daily. Patient not taking: Reported on 12/20/2015 08/24/15   Arlis Porta., MD   Not on File Review of Systems  Neurological: Positive for weakness.    Physical Exam  Constitutional: She appears cachectic. She appears ill.  HENT:  Mouth/Throat: Oropharynx is clear and moist.  Cardiovascular: Normal rate, regular rhythm and normal heart sounds.   Pulmonary/Chest: She has decreased breath sounds in the right lower field and the left lower field.  Abdominal: Soft. Bowel sounds are normal.  Skin: Skin is warm and dry.    Vital Signs: BP (!) 157/84   Pulse 64   Temp 97.8 F (36.6 C) (Oral)   Resp 18   Ht 4\' 8"  (1.422 m)   Wt 43.8 kg (96 lb 8 oz)   SpO2 98%   BMI 21.63 kg/m  Pain Assessment: 0-10   Pain Score: Asleep   SpO2: SpO2: 98 % O2 Device:SpO2: 98 % O2 Flow Rate: .   IO: Intake/output summary:  Intake/Output Summary (Last 24 hours) at 12/23/15 0910 Last data filed at 12/23/15 0400  Gross per 24 hour  Intake              750 ml  Output               50 ml  Net              700 ml      LBM:   Baseline Weight: Weight: 44.5 kg (98 lb) Most recent weight: Weight: 43.8 kg (96 lb 8 oz)     Palliative Assessment/Data:   30 % at best   Discussed with Dr Anselm Jungling  Time In: 0845 Time Out: 1000 Time Total: 75 min Greater than 50%  of this time was spent counseling and coordinating care related to the above assessment and plan.  Signed by: Wadie Lessen, NP   Please contact Palliative Medicine Team phone at [phone] for questions and concerns.  For individual provider: See Shea Evans

## 2015-12-23 NOTE — Progress Notes (Signed)
Pt complaining of itching all over.  Dr. Marcille Blanco ordered  Benadryl.

## 2015-12-23 NOTE — Progress Notes (Signed)
Notified of elevated K+ level.  Will repeat as I suspect blood was drawn from same arm as recently completed potassium infusion

## 2015-12-23 NOTE — Progress Notes (Signed)
Pt continues to scratch and itch her bottom and vagina area.  Dr. Marcille Blanco ordered Nystatin powder.

## 2015-12-23 NOTE — Progress Notes (Signed)
[name]    MR#:  [id number]  DATE OF BIRTH:  [birth year]  SUBJECTIVE:  CHIEF COMPLAINT:   Chief Complaint  Patient presents with  . Weakness  Patient is 82 year old Caucasian female with past medical history significant for history of breast carcinoma, gastroesophageal reflux disease, essential hypertension, who presents to the hospital with generalized weakness, decreased oral intake, decreased appetite, unsteady gait, confusion. On arrival to the hospital, she was noted to be hyperglycemic. She complains of generalized bone pains. Both scan revealed widespread metastasis . Patient received Zometa for hypercalcemia, she is being continued on IV fluids. Calcium is getting better, now Potassium is high. Review of Systems  Constitutional: Positive for malaise/fatigue. Negative for chills and fever.  HENT: Negative for congestion, ear discharge and hearing loss.   Eyes: Negative for blurred vision, double vision and redness.  Respiratory: Negative for cough, hemoptysis and shortness of breath.   Cardiovascular: Negative for chest pain, leg swelling and PND.  Gastrointestinal: Negative for abdominal pain, constipation and nausea.  Musculoskeletal: Negative for joint pain.  Skin: Negative for rash.  Neurological: Negative for dizziness, tremors, focal weakness, loss of consciousness and headaches.  Psychiatric/Behavioral: Negative for depression.    VITAL SIGNS: Blood pressure (!) 176/61, pulse 71, temperature 97.7 F (36.5 C), temperature source Oral, resp. rate 18, height 4\' 8"  (1.422 m), weight 43.8 kg (96 lb 8 oz), SpO2 96 %.  PHYSICAL EXAMINATION:   GENERAL:  82 y.o.-year-old patient lying in the bed with no acute distress. Sitting in the bed, complains of back pains EYES: Pupils equal, round, reactive to light and accommodation. No scleral icterus. Extraocular muscles intact.  HEENT: Head atraumatic,  normocephalic. Oropharynx and nasopharynx clear.  NECK:  Supple, no jugular venous distention. No thyroid enlargement, no tenderness.  LUNGS: Normal breath sounds bilaterally, no wheezing, rales,rhonchi or crepitation. No use of accessory muscles of respiration.  CARDIOVASCULAR: S1, S2 normal. No murmurs, rubs, or gallops.  ABDOMEN: Soft, nontender, nondistended. Bowel sounds present. No organomegaly or mass.  EXTREMITIES: 2+ lower extremity and pedal edema, no cyanosis, or clubbing.  NEUROLOGIC: Cranial nerves II through XII are intact. Muscle strength 4/5 in all extremities. Sensation intact. Gait not checked.  PSYCHIATRIC: The patient is alert and oriented x 3.  SKIN: No obvious rash, lesion, or ulcer.   ORDERS/RESULTS REVIEWED:   CBC  Recent Labs Lab 12/20/15 1035 12/21/15 1905 12/22/15 0445  WBC 7.8 6.8 6.1  HGB 12.1 12.4 11.8*  HCT 38.1 37.6 35.0  PLT 336 278 225  MCV 86.4 85.6 86.1  MCH 27.4 28.4 29.0  MCHC 31.8* 33.1 33.7  RDW 15.8* 15.7* 15.7*  LYMPHSABS 1,326 1.4  --   MONOABS 546 0.6  --   EOSABS 0* 0.0  --   BASOSABS 0 0.1  --    ------------------------------------------------------------------------------------------------------------------  Chemistries   Recent Labs Lab 12/20/15 1035 12/21/15 1905 12/22/15 0445 12/22/15 1518 12/23/15 0526 12/23/15 0839 12/23/15 1426  NA 145 141 142 140 145  --   --   K 2.9* 3.5 2.5* 3.7 6.4* 6.3* 5.2*  CL 98 95* 99* 99* 108  --   --   CO2 38* 36* 36* 35* 31  --   --   GLUCOSE 140* 184* 105* 166* 127*  --   --   BUN 17 23* 21* 19 20  --   --   CREATININE 1.01* 1.10* 0.95 0.90 0.85  --   --  CALCIUM 14.1* 14.0* 13.3* 13.1* 12.7*  --   --   MG  --   --  1.4*  --   --   --   --   AST  --  64* 45*  --  46*  --   --   ALT  --  30 25  --  27  --   --   ALKPHOS  --  159* 132*  --  147*  --   --   BILITOT  --  0.9 0.6  --  0.4  --   --     ------------------------------------------------------------------------------------------------------------------ estimated creatinine clearance is 25.7 mL/min (by C-G formula based on SCr of 0.85 mg/dL). ------------------------------------------------------------------------------------------------------------------  Recent Labs  12/21/15 2228  TSH 0.969    Cardiac Enzymes  Recent Labs Lab 12/22/15 0047 12/22/15 0445 12/22/15 1118  TROPONINI 0.05* 0.04* 0.05*   ------------------------------------------------------------------------------------------------------------------ Invalid input(s): POCBNP ---------------------------------------------------------------------------------------------------------------  RADIOLOGY: Dg Chest 2 View  Result Date: 12/21/2015 CLINICAL DATA:  Fatigue and weakness.  History of CHF. EXAM: CHEST  2 VIEW COMPARISON:  12/06/2015 FINDINGS: Small pleural effusions, greater on the right. These were also seen previously. There is a background of hyperinflation. Chronic cardiomegaly. Large hiatal hernia with gaseous distension less than previous. IMPRESSION: 1. Small pleural effusions which were also seen previously. Cardiomegaly without pulmonary edema. 2. Large hiatal hernia. Electronically Signed   By: Monte Fantasia M.D.   On: 12/21/2015 20:36   Ct Head Wo Contrast  Result Date: 12/21/2015 CLINICAL DATA:  Weakness, decreased appetite EXAM: CT HEAD WITHOUT CONTRAST TECHNIQUE: Contiguous axial images were obtained from the base of the skull through the vertex without intravenous contrast. COMPARISON:  None. FINDINGS: Brain: No evidence of acute infarction, hemorrhage, hydrocephalus, extra-axial collection or mass lesion/mass effect. Vascular: No hyperdense vessel or unexpected calcification. Skull: No evidence of calvarial fracture. Sinuses/Orbits: The visualized paranasal sinuses are essentially clear. The mastoid air cells are unopacified. Other: Age  related atrophy.  No ventriculomegaly. Subcortical white matter and periventricular small vessel ischemic changes. Intracranial atherosclerosis. IMPRESSION: No evidence of acute intracranial abnormality. Atrophy with small vessel ischemic changes. Electronically Signed   By: Julian Hy M.D.   On: 12/21/2015 20:40   Nm Bone Scan Whole Body  Result Date: 12/22/2015 CLINICAL DATA:  Breast cancer.  Hypercalcemia. EXAM: NUCLEAR MEDICINE WHOLE BODY BONE SCAN TECHNIQUE: Whole body anterior and posterior images were obtained approximately 3 hours after intravenous injection of radiopharmaceutical. RADIOPHARMACEUTICALS:  22.96 mCi Technetium-67m MDP IV COMPARISON:  10/25/2009 FINDINGS: There multifocal areas of abnormal bony uptake in the ribs bilaterally, right greater than left. Abnormal bony uptake within the thoracic spine and lower lumbar spine. Subtle focus of increased activity noted in the left lower femoral shaft. IMPRESSION: Areas of abnormal bony uptake within the ribs bilaterally, thoracic and lumbar spine, and left femur. Findings concerning for metastatic involvement. Electronically Signed   By: Rolm Baptise M.D.   On: 12/22/2015 14:24   US Venous Img Lower Bilateral  Result Date: 12/22/2015 CLINICAL DATA:  Bilateral lower extremity edema. EXAM: BILATERAL LOWER EXTREMITY VENOUS DOPPLER ULTRASOUND TECHNIQUE: Gray-scale sonography with graded compression, as well as color Doppler and duplex ultrasound were performed to evaluate the lower extremity deep venous systems from the level of the common femoral vein and including the common femoral, femoral, profunda femoral, popliteal and calf veins including the posterior tibial, peroneal and gastrocnemius veins when visible. The superficial great saphenous vein was also interrogated. Spectral Doppler was utilized to evaluate flow at rest  and with distal augmentation maneuvers in the common femoral, femoral and popliteal veins. COMPARISON:  None.  FINDINGS: RIGHT LOWER EXTREMITY Common Femoral Vein: No evidence of thrombus. Normal compressibility, respiratory phasicity and response to augmentation. Saphenofemoral Junction: No evidence of thrombus. Normal compressibility and flow on color Doppler imaging. Profunda Femoral Vein: No evidence of thrombus. Normal compressibility and flow on color Doppler imaging. Femoral Vein: No evidence of thrombus. Normal compressibility, respiratory phasicity and response to augmentation. Popliteal Vein: No evidence of thrombus. Normal compressibility, respiratory phasicity and response to augmentation. Calf Veins: No evidence of thrombus. Normal compressibility and flow on color Doppler imaging. Superficial Great Saphenous Vein: No evidence of thrombus. Normal compressibility and flow on color Doppler imaging. Venous Reflux:  None. Other Findings: No evidence of superficial thrombophlebitis or abnormal fluid collection. LEFT LOWER EXTREMITY Common Femoral Vein: No evidence of thrombus. Normal compressibility, respiratory phasicity and response to augmentation. Saphenofemoral Junction: No evidence of thrombus. Normal compressibility and flow on color Doppler imaging. Profunda Femoral Vein: No evidence of thrombus. Normal compressibility and flow on color Doppler imaging. Femoral Vein: No evidence of thrombus. Normal compressibility, respiratory phasicity and response to augmentation. Popliteal Vein: No evidence of thrombus. Normal compressibility, respiratory phasicity and response to augmentation. Calf Veins: No evidence of thrombus. Normal compressibility and flow on color Doppler imaging. Superficial Great Saphenous Vein: No evidence of thrombus. Normal compressibility and flow on color Doppler imaging. Venous Reflux:  None. Other Findings: No evidence of superficial thrombophlebitis or abnormal fluid collection. IMPRESSION: No evidence of bilateral lower extremity deep venous thrombosis. Electronically Signed   By: Aletta Edouard M.D.   On: 12/22/2015 19:21    EKG:  Orders placed or performed during the hospital encounter of 12/21/15  . EKG 12-Lead  . EKG 12-Lead    ASSESSMENT AND PLAN:  Active Problems:   Hypercalcemia #1. Hypercalcemia of malignancy, status post Zometa 12/22/2015, continue IV fluids,   both scan is concerning for widespread bony metastases, appreciate oncologist's input, get palliative care involved to clarify goals of further care.   PT eval to help decide placement. #2. Hypokalemia, supplementing intravenously, magnesium level was checked, was found to be low, supplemented intravenously, check magnesium , now hyperkalemia. #3. Elevated troponin, likely demand ischemia, supportive therapy #4. Metastatic generalized body pains, supportive therapy #5. Lower extremity swelling, bilateral, unclear etiology, negative Doppler ultrasound to rule out DVT. Started back on lasix. #6 Hyperkalemia   Kayexalate, recheck.  Management plans discussed with the patient, family and they are in agreement.   DRUG ALLERGIES: Not on File  CODE STATUS:     Code Status Orders        Start     Ordered   12/22/15 0047  Do not attempt resuscitation (DNR)  Continuous    Question Answer Comment  In the event of cardiac or respiratory ARREST Do not call a "code blue"   In the event of cardiac or respiratory ARREST Do not perform Intubation, CPR, defibrillation or ACLS   In the event of cardiac or respiratory ARREST Use medication by any route, position, wound care, and other measures to relive pain and suffering. May use oxygen, suction and manual treatment of airway obstruction as needed for comfort.      12/22/15 0047    Code Status History    Date Active Date Inactive Code Status Order ID Comments User Context   12/21/2015 11:38 PM 12/22/2015 12:47 AM Full Code [id number]  Harvie Bridge, DO Inpatient   05/20/2015  1:59 AM 05/21/2015  7:19 PM Full Code [id number]  Saundra Shelling, MD ED     Advance Directive Documentation   Flowsheet Row Most Recent Value  Type of Advance Directive  Healthcare Power of Attorney  Pre-existing out of facility DNR order (yellow form or pink MOST form)  No data  "MOST" Form in Place?  No data      TOTAL TIME TAKING CARE OF THIS PATIENT: 40 minutes.    Vaughan Basta M.D on 12/23/2015 at 3:54 PM  Between 7am to 6pm - Pager - [phone]  After 6pm go to [email or web address] - password [email or web address] [facility]  Office  [phone]  CC: Primary care physician; Dicky Doe, MD

## 2015-12-23 NOTE — Progress Notes (Signed)
Palliative Medicine consult noted. Due to high referral volume, there may be a delay seeing this patient. Please call the Palliative Medicine Team office at [phone] if recommendations are needed in the interim.  Thank you for inviting [place] to see this patient.  Marjie Skiff Kaedin Hicklin, RN, BSN, [facility] 12/23/2015 9:03 AM Cell [phone] 8:00-4:00 Monday-Friday Office [phone]

## 2015-12-23 NOTE — Progress Notes (Signed)
PT Cancellation Note  Patient Details Name: Krista Randall MRN: [id number] DOB: June 27, 1920   Cancelled Treatment:    Reason Eval/Treat Not Completed: Medical issues which prohibited therapy.  Pt's critical potassium of 6.3 noted today.  Per PT guidelines, pt currently contraindicated for PT d/t significantly elevated potassium.  Will re-attempt PT eval at a later date/time as medically appropriate.   Raquel Sarna Eugenia Eldredge 12/23/2015, 10:51 AM Leitha Bleak, [place]

## 2015-12-24 DIAGNOSIS — Z79899 Other long term (current) drug therapy: Secondary | ICD-10-CM

## 2015-12-24 DIAGNOSIS — C801 Malignant (primary) neoplasm, unspecified: Secondary | ICD-10-CM

## 2015-12-24 DIAGNOSIS — Z853 Personal history of malignant neoplasm of breast: Secondary | ICD-10-CM

## 2015-12-24 DIAGNOSIS — Z9012 Acquired absence of left breast and nipple: Secondary | ICD-10-CM

## 2015-12-24 DIAGNOSIS — I509 Heart failure, unspecified: Secondary | ICD-10-CM

## 2015-12-24 DIAGNOSIS — I10 Essential (primary) hypertension: Secondary | ICD-10-CM

## 2015-12-24 DIAGNOSIS — R410 Disorientation, unspecified: Secondary | ICD-10-CM

## 2015-12-24 DIAGNOSIS — R978 Other abnormal tumor markers: Secondary | ICD-10-CM

## 2015-12-24 DIAGNOSIS — C7951 Secondary malignant neoplasm of bone: Principal | ICD-10-CM

## 2015-12-24 DIAGNOSIS — M1990 Unspecified osteoarthritis, unspecified site: Secondary | ICD-10-CM

## 2015-12-24 DIAGNOSIS — N63 Unspecified lump in breast: Secondary | ICD-10-CM

## 2015-12-24 LAB — MAGNESIUM: Magnesium: 2.1 mg/dL (ref 1.7–2.4)

## 2015-12-24 LAB — BASIC METABOLIC PANEL
ANION GAP: 4 — AB (ref 5–15)
BUN: 17 mg/dL (ref 6–20)
CO2: 34 mmol/L — ABNORMAL HIGH (ref 22–32)
Calcium: 11.3 mg/dL — ABNORMAL HIGH (ref 8.9–10.3)
Chloride: 106 mmol/L (ref 101–111)
Creatinine, Ser: 0.87 mg/dL (ref 0.44–1.00)
GFR calc Af Amer: >60 mL/min (ref >60)
GFR, EST NON AFRICAN AMERICAN: 56 mL/min — AB (ref >60)
GLUCOSE: 119 mg/dL — AB (ref 65–99)
POTASSIUM: 4.4 mmol/L (ref 3.5–5.1)
Sodium: 144 mmol/L (ref 135–145)

## 2015-12-24 MED ORDER — QUETIAPINE FUMARATE 25 MG PO TABS: 25.0000 mg | ORAL_TABLET | Freq: Every day | ORAL | 0 refills | Status: AC

## 2015-12-24 MED ORDER — CLONIDINE HCL 0.1 MG PO TABS: 0.1000 mg | ORAL_TABLET | Freq: Three times a day (TID) | ORAL | 0 refills | Status: AC

## 2015-12-24 MED ORDER — AMLODIPINE BESYLATE 10 MG PO TABS: 10.0000 mg | ORAL_TABLET | Freq: Every day | ORAL | 0 refills | Status: AC

## 2015-12-24 MED ORDER — MORPHINE SULFATE (CONCENTRATE) 20 MG/ML PO SOLN: 5.0000 mg | ORAL | 0 refills | Status: DC | PRN

## 2015-12-24 NOTE — Care Management Important Message (Signed)
Important Message  Patient Details  Name: Krista Randall MRN: [id number] Date of Birth: [birth year]   Medicare Important Message Given:  Yes    Shelbie Ammons, RN 12/24/2015, 8:28 AM

## 2015-12-24 NOTE — Consult Note (Signed)
[place]  [phone] [phone]  ID: Lurlean Horns OB: 24-Jan-1921  MR#: [id number]  [id number]  Patient Care Team: Arlis Porta., MD as PCP - General (Family Medicine)  CHIEF COMPLAINT: Hypercalcemia, widespread bony metastasis with elevated CA-27-29 highly suspicious for metastatic breast cancer.  INTERVAL HISTORY: Patient is a 82 year old female who was recently admitted to the hospital with altered mental status and found to have to give can hypercalcemia. Further workup included bone scan which revealed widespread bony metastasis as well as a significantly elevated CA 27-29. Currently, patient is still mildly confused but improved. She has elected to enroll in hospice and is being discharged this afternoon.   REVIEW OF SYSTEMS:   Review of Systems  Unable to perform ROS: Medical condition    As per HPI. Otherwise, a complete review of systems is negatve.  PAST MEDICAL HISTORY: Past Medical History:  Diagnosis Date  . Arthritis   . Breast cancer ([place]) 2009   left breast  . Breast mass march 2016   rt lump @ [facility]   . CHF (congestive heart failure) ([place])   . Heartburn   . Hernia, hiatal   . Hypertension     PAST SURGICAL HISTORY: Past Surgical History:  Procedure Laterality Date  . BREAST BIOPSY Right 1980's   negative  . CHOLECYSTECTOMY    . MASTECTOMY Left 2009    FAMILY HISTORY: Family History  Problem Relation Age of Onset  . Colon cancer Mother 65  . Cancer Brother 75    lung  . Diabetes Mellitus I Daughter   . Kidney disease Neg Hx        ADVANCED DIRECTIVES (Y/N):  @ADVDIR @   HEALTH MAINTENANCE: Social History  Substance Use Topics  . Smoking status: Never Smoker  . Smokeless tobacco: Never Used  . Alcohol use No     Colonoscopy:  PAP:  Bone density:  Lipid panel:  Not on File  Current Facility-Administered Medications  Medication Dose Route Frequency Provider Last Rate Last  Dose  . acetaminophen (TYLENOL) tablet 650 mg  650 mg Oral Q6H PRN Alexis Hugelmeyer, DO   650 mg at 12/23/15 2216   Or  . acetaminophen (TYLENOL) suppository 650 mg  650 mg Rectal Q6H PRN Alexis Hugelmeyer, DO      . acidophilus (RISAQUAD) capsule 1 capsule  1 capsule Oral Daily Alexis Hugelmeyer, DO   1 capsule at 12/24/15 0847  . amLODipine (NORVASC) tablet 10 mg  10 mg Oral Daily Alexis Hugelmeyer, DO   10 mg at 12/24/15 0847  . antiseptic oral rinse (CPC / CETYLPYRIDINIUM CHLORIDE 0.05%) solution 7 mL  7 mL Mouth Rinse BID Alexis Hugelmeyer, DO   7 mL at 12/24/15 0848  . aspirin EC tablet 81 mg  81 mg Oral Daily Alexis Hugelmeyer, DO   81 mg at 12/24/15 0847  . bisacodyl (DULCOLAX) EC tablet 5 mg  5 mg Oral Daily PRN Alexis Hugelmeyer, DO      . carvedilol (COREG) tablet 6.25 mg  6.25 mg Oral BID WC Alexis Hugelmeyer, DO   6.25 mg at 12/24/15 1618  . cloNIDine (CATAPRES - Dosed in mg/24 hr) patch 0.3 mg  0.3 mg Transdermal Weekly Theodoro Grist, MD   0.3 mg at 12/22/15 0811  . enoxaparin (LOVENOX) injection 30 mg  30 mg Subcutaneous Q24H Alexis Hugelmeyer, DO   30 mg at 12/23/15 2132  . ferrous sulfate tablet 325 mg  325 mg Oral Q breakfast  Alexis Hugelmeyer, DO   325 mg at 12/24/15 0847  . fluticasone (FLONASE) 50 MCG/ACT nasal spray 1 spray  1 spray Each Nare Daily Alexis Hugelmeyer, DO   1 spray at 12/24/15 0847  . furosemide (LASIX) tablet 20 mg  20 mg Oral Daily Vaughan Basta, MD   20 mg at 12/24/15 0847  . HYDROcodone-acetaminophen (NORCO/VICODIN) 5-325 MG per tablet 1-2 tablet  1-2 tablet Oral Q4H PRN Alexis Hugelmeyer, DO   1 tablet at 12/22/15 2359  . latanoprost (XALATAN) 0.005 % ophthalmic solution 1 drop  1 drop Both Eyes QHS Alexis Hugelmeyer, DO   1 drop at 12/23/15 2127  . letrozole [facility]) tablet 2.5 mg  2.5 mg Oral Daily Lloyd Huger, MD   2.5 mg at 12/24/15 0847  . levothyroxine (SYNTHROID, LEVOTHROID) tablet 75 mcg  75 mcg Oral QAC breakfast Alexis Hugelmeyer,  DO   75 mcg at 12/24/15 0847  . magnesium citrate solution 1 Bottle  1 Bottle Oral Once PRN Alexis Hugelmeyer, DO      . nystatin (MYCOSTATIN/NYSTOP) topical powder   Topical TID Harrie Foreman, MD      . ondansetron [facility]) tablet 4 mg  4 mg Oral Q6H PRN Alexis Hugelmeyer, DO       Or  . ondansetron (ZOFRAN) injection 4 mg  4 mg Intravenous Q6H PRN Alexis Hugelmeyer, DO      . pantoprazole (PROTONIX) EC tablet 40 mg  40 mg Oral Daily Alexis Hugelmeyer, DO   40 mg at 12/24/15 0847  . senna-docusate (Senokot-S) tablet 1 tablet  1 tablet Oral QHS PRN Alexis Hugelmeyer, DO      . sodium chloride flush (NS) 0.9 % injection 3 mL  3 mL Intravenous Q12H Alexis Hugelmeyer, DO   3 mL at 12/24/15 0847  . sucralfate (CARAFATE) tablet 1 g  1 g Oral BID Alexis Hugelmeyer, DO   1 g at 12/24/15 0847    OBJECTIVE: Vitals:   12/24/15 0419 12/24/15 1244  BP: (!) 173/50 (!) 132/44  Pulse: 60 60  Resp:  18  Temp:  97.6 F (36.4 C)     Body mass index is 21.63 kg/m.    ECOG FS:2 - Symptomatic, <50% confined to bed  General: Well-developed, well-nourished, no acute distress. Eyes: Pink conjunctiva, anicteric sclera. Lungs: Clear to auscultation bilaterally. Heart: Regular rate and rhythm. No rubs, murmurs, or gallops. Abdomen: Soft, nontender, nondistended. No organomegaly noted, normoactive bowel sounds. Musculoskeletal: No edema, cyanosis, or clubbing. Neuro: Alert. Cranial nerves grossly intact. Skin: No rashes or petechiae noted. Psych: Normal affect, mildly confused.   LAB RESULTS:  Lab Results  Component Value Date   NA 144 12/24/2015   K 4.4 12/24/2015   CL 106 12/24/2015   CO2 34 (H) 12/24/2015   GLUCOSE 119 (H) 12/24/2015   BUN 17 12/24/2015   CREATININE 0.87 12/24/2015   CALCIUM 11.3 (H) 12/24/2015   PROT 6.1 (L) 12/23/2015   ALBUMIN 3.3 (L) 12/23/2015   AST 46 (H) 12/23/2015   ALT 27 12/23/2015   ALKPHOS 147 (H) 12/23/2015   BILITOT 0.4 12/23/2015   GFRNONAA 56 (L)  12/24/2015   GFRAA >60 12/24/2015    Lab Results  Component Value Date   WBC 6.1 12/22/2015   NEUTROABS 4.7 12/21/2015   HGB 11.8 (L) 12/22/2015   HCT 35.0 12/22/2015   MCV 86.1 12/22/2015   PLT 225 12/22/2015   Lab Results  Component Value Date   LABCA2 1,048.1 (H) 12/22/2015  STUDIES: Dg Chest 2 View  Result Date: 12/21/2015 CLINICAL DATA:  Fatigue and weakness.  History of CHF. EXAM: CHEST  2 VIEW COMPARISON:  12/06/2015 FINDINGS: Small pleural effusions, greater on the right. These were also seen previously. There is a background of hyperinflation. Chronic cardiomegaly. Large hiatal hernia with gaseous distension less than previous. IMPRESSION: 1. Small pleural effusions which were also seen previously. Cardiomegaly without pulmonary edema. 2. Large hiatal hernia. Electronically Signed   By: Monte Fantasia M.D.   On: 12/21/2015 20:36   Dg Chest 2 View  Result Date: 12/06/2015 CLINICAL DATA:  RIGHT pleural effusion by prior rib radiographs, shortness of breath, question CHF, history breast cancer EXAM: CHEST  2 VIEW COMPARISON:  11/29/2015 FINDINGS: Normal heart size and pulmonary vascularity. Aortic atherosclerotic calcification. Large hiatal hernia. Emphysematous changes with bibasilar effusions and atelectasis. Remaining lungs clear. No pneumothorax. Diffuse osseous demineralization with levoconvex thoracolumbar scoliosis. IMPRESSION: COPD changes with bibasilar pleural effusions and atelectasis. Large hiatal hernia. Aortic atherosclerosis. Electronically Signed   By: Lavonia Dana M.D.   On: 12/06/2015 17:04   Dg Ribs Unilateral Left  Result Date: 11/29/2015 CLINICAL DATA:  Fall 2 months ago with persistent left chest pain, initial encounter EXAM: LEFT RIBS - 2 VIEW COMPARISON:  05/19/2015 FINDINGS: Cardiac shadow is within normal limits. A large hiatal hernia is seen. Aortic calcifications are again noted and stable. New blunting of the right costophrenic angle is noted which  may be related to small effusion. No definitive rib fracture is noted. No pneumothorax is seen. IMPRESSION: New small right pleural effusion. No acute rib fracture is noted. Electronically Signed   By: Inez Catalina M.D.   On: 11/29/2015 15:02   Ct Head Wo Contrast  Result Date: 12/21/2015 CLINICAL DATA:  Weakness, decreased appetite EXAM: CT HEAD WITHOUT CONTRAST TECHNIQUE: Contiguous axial images were obtained from the base of the skull through the vertex without intravenous contrast. COMPARISON:  None. FINDINGS: Brain: No evidence of acute infarction, hemorrhage, hydrocephalus, extra-axial collection or mass lesion/mass effect. Vascular: No hyperdense vessel or unexpected calcification. Skull: No evidence of calvarial fracture. Sinuses/Orbits: The visualized paranasal sinuses are essentially clear. The mastoid air cells are unopacified. Other: Age related atrophy.  No ventriculomegaly. Subcortical white matter and periventricular small vessel ischemic changes. Intracranial atherosclerosis. IMPRESSION: No evidence of acute intracranial abnormality. Atrophy with small vessel ischemic changes. Electronically Signed   By: Julian Hy M.D.   On: 12/21/2015 20:40   Nm Bone Scan Whole Body  Result Date: 12/22/2015 CLINICAL DATA:  Breast cancer.  Hypercalcemia. EXAM: NUCLEAR MEDICINE WHOLE BODY BONE SCAN TECHNIQUE: Whole body anterior and posterior images were obtained approximately 3 hours after intravenous injection of radiopharmaceutical. RADIOPHARMACEUTICALS:  22.96 mCi Technetium-4m MDP IV COMPARISON:  10/25/2009 FINDINGS: There multifocal areas of abnormal bony uptake in the ribs bilaterally, right greater than left. Abnormal bony uptake within the thoracic spine and lower lumbar spine. Subtle focus of increased activity noted in the left lower femoral shaft. IMPRESSION: Areas of abnormal bony uptake within the ribs bilaterally, thoracic and lumbar spine, and left femur. Findings concerning for  metastatic involvement. Electronically Signed   By: Rolm Baptise M.D.   On: 12/22/2015 14:24   US Venous Img Lower Bilateral  Result Date: 12/22/2015 CLINICAL DATA:  Bilateral lower extremity edema. EXAM: BILATERAL LOWER EXTREMITY VENOUS DOPPLER ULTRASOUND TECHNIQUE: Gray-scale sonography with graded compression, as well as color Doppler and duplex ultrasound were performed to evaluate the lower extremity deep venous systems from the level  of the common femoral vein and including the common femoral, femoral, profunda femoral, popliteal and calf veins including the posterior tibial, peroneal and gastrocnemius veins when visible. The superficial great saphenous vein was also interrogated. Spectral Doppler was utilized to evaluate flow at rest and with distal augmentation maneuvers in the common femoral, femoral and popliteal veins. COMPARISON:  None. FINDINGS: RIGHT LOWER EXTREMITY Common Femoral Vein: No evidence of thrombus. Normal compressibility, respiratory phasicity and response to augmentation. Saphenofemoral Junction: No evidence of thrombus. Normal compressibility and flow on color Doppler imaging. Profunda Femoral Vein: No evidence of thrombus. Normal compressibility and flow on color Doppler imaging. Femoral Vein: No evidence of thrombus. Normal compressibility, respiratory phasicity and response to augmentation. Popliteal Vein: No evidence of thrombus. Normal compressibility, respiratory phasicity and response to augmentation. Calf Veins: No evidence of thrombus. Normal compressibility and flow on color Doppler imaging. Superficial Great Saphenous Vein: No evidence of thrombus. Normal compressibility and flow on color Doppler imaging. Venous Reflux:  None. Other Findings: No evidence of superficial thrombophlebitis or abnormal fluid collection. LEFT LOWER EXTREMITY Common Femoral Vein: No evidence of thrombus. Normal compressibility, respiratory phasicity and response to augmentation. Saphenofemoral  Junction: No evidence of thrombus. Normal compressibility and flow on color Doppler imaging. Profunda Femoral Vein: No evidence of thrombus. Normal compressibility and flow on color Doppler imaging. Femoral Vein: No evidence of thrombus. Normal compressibility, respiratory phasicity and response to augmentation. Popliteal Vein: No evidence of thrombus. Normal compressibility, respiratory phasicity and response to augmentation. Calf Veins: No evidence of thrombus. Normal compressibility and flow on color Doppler imaging. Superficial Great Saphenous Vein: No evidence of thrombus. Normal compressibility and flow on color Doppler imaging. Venous Reflux:  None. Other Findings: No evidence of superficial thrombophlebitis or abnormal fluid collection. IMPRESSION: No evidence of bilateral lower extremity deep venous thrombosis. Electronically Signed   By: Aletta Edouard M.D.   On: 12/22/2015 19:21    ASSESSMENT: Hypercalcemia, widespread bony metastasis with elevated CA-27-29 highly suspicious for metastatic breast cancer.  PLAN:    1. Metastatic breast cancer: Patient has a history of ER/PR positive breast cancer. Given her widespread bony metastasis, hypercalcemia, elevated CA-27-29 this is highly suspicious for recurrence. Patient was initially started on letrozole for palliative treatment, but ultimately decided to enroll in hospice and letrozole will be discontinued upon discharge. No further follow-up is necessary in the [place]. 2. Hypercalcemia: Improved, but still elevated. Secondary to metastatic disease. 3. Confusion/altered mental status: Secondary to hypercalcemia, mildly improved. Hospice as above.  Appreciate consult, call with questions.  Lloyd Huger, MD   12/24/2015 4:19 PM

## 2015-12-24 NOTE — Care Management (Addendum)
Discussed Hospice agencies with daughter, Manuela Schwartz. [facility]. Flo Shanks RN representative for [facility] updated. Discharge to home today per Dr. Anselm Jungling. Transportation will be arranged through [employer] unit. Shelbie Ammons RN MSN CCM Care Management [phone]

## 2015-12-24 NOTE — Evaluation (Signed)
Physical Therapy Evaluation Patient Details Name: Krista Randall MRN: [id number] DOB: [birth year] Today's Date: 12/24/2015   History of Present Illness  Pt is a 82 yr old female with known history of L breast CA, HTN, arthritis, and CHF presenting with a decline in functional status with symptoms of decreased by mouth intake, decreased appetite, unsteady gait, progressive fatigue, and confusion. Admitted with hypercalemia, AKI, and elevated troponin (attributed to demand ischemia). Bone scan highly suggestive for widespread metastasis to L femur, bilat ribs, and thoracic/lumbar spine.  Clinical Impression  Prior to admission, pt was mod I with household mobility and basic ADLs using a rollator walker, though has been experiencing a progressive decline in functional status x2wks.  Pt lives alone in a one-story home with 4 STE (2 STE at back door).  Pt has a large family whom all live locally and are very supportive, present at the hospital this date. Currently, pt is min guard for supine > sit, sit <> stand, and ambulation x54ft with RW EOB > chair.  Pt is not able to ambulate further secondary to significant fatigue.  Per MD, pt and family wish for pt to return home with hospice services.  Pt presents with generalized weakness and decreased activity tolerance below baseline, and would benefit from skilled PT to address noted impairments and functional limitations.  Recommend pt discharge home with manual w/c, BSC, HHPT, 24hr supervision, and support from family when medically appropriate.     Follow Up Recommendations Home health PT;Supervision for mobility/OOB    Equipment Recommendations  3in1 (PT);Wheelchair    Recommendations for Other Services       Precautions / Restrictions Precautions Precautions: Fall Restrictions Weight Bearing Restrictions: No      Mobility  Bed Mobility Overal bed mobility: Needs Assistance Bed Mobility: Supine to Sit;Rolling Rolling: Modified independent  (Device/Increase time) Supine to sit: Min guard General bed mobility comments: Pt rolls bilaterally using UE and LE assist mod I with increased time and use of bed rails. Pt able to achieve supine > sit with HOB elevated and significantly increased time. Min guard for safety.  Transfers Overall transfer level: Needs assistance Equipment used: Rolling walker (2 wheeled) Transfers: Sit to/from Stand Sit to Stand: Min guard General transfer comment: Pt able to complete sit <> stand transfer with significant BUE assist and increased time. Min guard for safety. Pt c/o increased back pain with standing, encouraged postural correction and UE support on RW.   Ambulation/Gait Ambulation/Gait assistance: Min guard Ambulation Distance (Feet): 3 Feet Assistive device: Rolling walker (2 wheeled) Gait Pattern/deviations: Decreased stride length;Step-to pattern;Narrow base of support Gait velocity: Decreased General Gait Details: Pt ambulated EOB > chair, declined further ambulation d/t significant fatigue. Vc's for sequencing and DME use.   Stairs    Wheelchair Mobility    Modified Rankin (Stroke Patients Only)       Balance Overall balance assessment: Needs assistance Sitting-balance support: Feet supported;Single extremity supported Sitting balance-Leahy Scale: Good Sitting balance - Comments: Pt able to reach outside BOS bilaterally with B feet supported and unilateral UE support.  Standing balance support: Bilateral upper extremity supported (on RW) Standing balance-Leahy Scale: Fair Standing balance comment: Min guard for safety      Pertinent Vitals/Pain Pain Assessment: No/denies pain  HR and O2 monitored throughout session and maintained WFL.     Home Living Family/patient expects to be discharged to:: Private residence Living Arrangements: Alone Available Help at Discharge: Family (4 children, multiple grandchildren/great-grandchildren) Type of Home: House  Home Access:  Stairs to enter Entrance Stairs-Rails: Right;Left;Can reach both Entrance Stairs-Number of Steps: 4 (in the front, 2 in the back) Home Layout: One level Home Equipment: Walker - 4 wheels;Walker - 2 wheels;Cane - single point;Shower seat      Prior Function Level of Independence: Independent with assistive device(s)  Comments: Pt uses rollator walker in the home and SPC outside, though reports she rarely leaves the house. Increasing assist with ADLs over the past 2 weeks.     Hand Dominance        Extremity/Trunk Assessment   Upper Extremity Assessment: Generalized weakness Grip strength symmetrical  Lower Extremity Assessment: Generalized weakness At least 3/5 throughout, did not resist Light touch sensation intact and symmetrical  Cervical / Trunk Assessment: Normal    Communication   Communication: HOH  Cognition Arousal/Alertness: Awake/alert Behavior During Therapy: Flat affect Overall Cognitive Status: Within Functional Limits for tasks assessed    General Comments General comments (skin integrity, edema, etc.): BLE edema   Pt resting supine with 4 daughters (and 2 son-in-laws) present bedside. Pt and family agreeable to PT evaluation.    Exercises General Exercises - Lower Extremity Ankle Circles/Pumps: AROM Quad Sets: AROM Gluteal Sets: AROM Short Arc Quad: AROM Heel Slides: AROM Hip ABduction/ADduction: AROM Straight Leg Raises: AROM  All exercises performed bilaterally x 5 reps in supine.       Assessment/Plan    PT Assessment Patient needs continued PT services  PT Diagnosis Difficulty walking;Generalized weakness   PT Problem List Decreased strength;Decreased activity tolerance;Decreased balance;Decreased mobility  PT Treatment Interventions DME instruction;Gait training;Stair training;Functional mobility training;Therapeutic activities;Therapeutic exercise;Balance training;Patient/family education   PT Goals (Current goals can be found in the Care  Plan section) Acute Rehab PT Goals Patient Stated Goal: To go home PT Goal Formulation: With patient Time For Goal Achievement: 01/07/16 Potential to Achieve Goals: Good    Frequency Min 2X/week   Barriers to discharge           End of Session Equipment Utilized During Treatment: Gait belt Activity Tolerance: Patient tolerated treatment well;Patient limited by fatigue Patient left: in chair;with call bell/phone within reach;with chair alarm set Nurse Communication: Mobility status;Precautions         Time: [id number] PT Time Calculation (min) (ACUTE ONLY): 30 min   Charges:         PT G Codes:        Kenidee Cregan, SPT 12/24/2015, 11:07 AM

## 2015-12-24 NOTE — Progress Notes (Signed)
Pt daughter given d/c instructions r/t activity, diet, follow up care, when to call MD and medications, prescriptions x 4 given to daughter, voiced understanding, EMS notified of need for transport

## 2015-12-24 NOTE — Progress Notes (Signed)
Pt transferred home via stretcher by EMS per orders, family present

## 2015-12-24 NOTE — Progress Notes (Signed)
New referral for [facility] services at home following discharge received from [facility]. [name] is a 82 year old woman with past medical history significant for history of breast cancer, gastroesophageal reflux disease, and essential hypertension, who presents to the hospital with generalized weakness, decreased oral intake, decreased appetite, unsteady gait, confusion. On arrival to the hospital, she was noted to be hyperglycemic. She had complaints of generalized bone pains. Bone scan revealed widespread metastasis . She has received Zometa for hypercalcemia, IV fluids and electrolyte correction. Family met with Palliative Medicine NP Wadie Lessen and have discussed goals of care. They spoke again today with attending physician Dr. Anselm Jungling and have decided to focus on her comfort at home with hospice care.  Writer met in the family room with [name], Krista Randall and [name] to initiate education regarding hospice services, philosophy and team approach to care with good understanding voiced. Family has requested a hospital bed, fully electric with top 1/2 rails and a bedside commode to be delivered after 3 pm today. Plan is for patient to discharge today after equipment is delivered. Family requested that medications be adjusted/decreased in number and also to have a medication to treat patient's night time restlessness. Writer and family spoke with attending Dr. Anselm Jungling who will address. Patient to discharge with prescriptions for Seroquel 25 mg QHS and liquid morphine, 20 mg/ml, dose 0.35m/5 mg, family in agreement and appreciative Patient information faxed to hospice referral. Plan is for discharge today via EMS when equipment is delivered. Hospital Care team aware. Signed portable DNR in place in patient's chart. Thank you. KFlo ShanksRN, BSN, [facility] hospital liaison [phone]

## 2015-12-27 ENCOUNTER — Telehealth: Payer: Self-pay | Admitting: Family Medicine

## 2015-12-27 NOTE — Telephone Encounter (Signed)
Mitzi with Hospice said pt's urine had a strong smell, a pink tint and very frequent.  She thinks she has enough urine to sent a sample to lab for urinalysis and culture.  She needs a call back for order and to see if Dr. Luan Pulling wants her to start on antibiotics.  Her call back number is [phone]

## 2015-12-27 NOTE — Telephone Encounter (Signed)
I have called Krista Randall back and left message for UA and Urine Culture.  Will wait on antibiotics until I see UA results.

## 2015-12-28 ENCOUNTER — Telehealth: Payer: Self-pay | Admitting: Family Medicine

## 2015-12-28 ENCOUNTER — Other Ambulatory Visit: Payer: Self-pay

## 2015-12-28 NOTE — Telephone Encounter (Signed)
Krista Randall returned your call.  Please call [phone]

## 2015-12-28 NOTE — Telephone Encounter (Signed)
I have talked with Krista Randall today,-jh

## 2015-12-29 ENCOUNTER — Ambulatory Visit: Payer: Medicare Other

## 2016-01-04 NOTE — Discharge Summary (Signed)
[facility] [place] NAME: Krista Randall    MR#:  [id number]  DATE OF BIRTH:  1921/04/17  DATE OF ADMISSION:  12/21/2015 ADMITTING PHYSICIAN: Harvie Bridge, DO  DATE OF DISCHARGE: 12/24/2015  5:34 PM  PRIMARY CARE PHYSICIAN: Dicky Doe, MD    ADMISSION DIAGNOSIS:  Hypercalcemia [E83.52] Unsteady gait [R26.81]  DISCHARGE DIAGNOSIS:  Active Problems:   Hypercalcemia   DNR (do not resuscitate)   Palliative care by specialist   Weakness generalized   Carcinoma of breast metastatic to bone [facility])  SECONDARY DIAGNOSIS:   Past Medical History:  Diagnosis Date  . Arthritis   . Breast cancer ([place]) 2009   left breast  . Breast mass march 2016   rt lump @ [facility]   . CHF (congestive heart failure) ([place])   . Heartburn   . Hernia, hiatal   . Hypertension     HOSPITAL COURSE:   #1. Hypercalcemia of malignancy, status post Zometa 12/22/2015, continue IV fluids,   both scan is concerning for widespread bony metastases, appreciate oncologist's input, get palliative care involved to clarify goals of further care.   PT eval to help decide placement.   After discussing with Oncology and pallaitive care- pt and family agreed on hospice placement. #2. Hypokalemia, supplementing intravenously, magnesium level was checked, was found to be low, supplemented intravenously, check magnesium , now hyperkalemia. #3. Elevated troponin, likely demand ischemia, supportive therapy #4. Metastatic generalized body pains, supportive therapy #5. Lower extremity swelling, bilateral, unclear etiology, negative Doppler ultrasound to rule out DVT. Started back on lasix. #6 Hyperkalemia   Kayexalate, recheck.- came down.  DISCHARGE CONDITIONS:   Stable.  CONSULTS OBTAINED:  Treatment Team:  Lloyd Huger, MD  DRUG ALLERGIES:  Not on File  DISCHARGE MEDICATIONS:   Discharge Medication List as of 12/24/2015  3:28 PM    START taking  these medications   Details  morphine (ROXANOL) 20 MG/ML concentrated solution Take 0.25 mLs (5 mg total) by mouth every 3 (three) hours as needed for moderate pain or severe pain., Starting Fri 12/24/2015, Print    QUEtiapine (SEROQUEL) 25 MG tablet Take 1 tablet (25 mg total) by mouth at bedtime., Starting Fri 12/24/2015, Print      CONTINUE these medications which have CHANGED   Details  amLODipine (NORVASC) 10 MG tablet Take 1 tablet (10 mg total) by mouth daily., Starting Fri 12/24/2015, Print    cloNIDine (CATAPRES) 0.1 MG tablet Take 1 tablet (0.1 mg total) by mouth 3 (three) times daily., Starting Fri 12/24/2015, Print      CONTINUE these medications which have NOT CHANGED   Details  carvedilol (COREG) 6.25 MG tablet Take 1 tablet (6.25 mg total) by mouth 2 (two) times daily with a meal., Starting Wed 10/06/2015, Normal    latanoprost (XALATAN) 0.005 % ophthalmic solution Starting Fri 07/30/2015, Historical Med    levothyroxine (SYNTHROID, LEVOTHROID) 75 MCG tablet Take 75 mcg by mouth daily., Starting Tue 05/18/2015, Historical Med    tolterodine (DETROL LA) 2 MG 24 hr capsule Take 1 capsule (2 mg total) by mouth daily., Starting Tue 08/24/2015, Normal      STOP taking these medications     estradiol (ESTRACE) 0.1 MG/GM vaginal cream      ferrous sulfate 325 (65 FE) MG tablet      fluticasone (FLONASE) 50 MCG/ACT nasal spray      furosemide (LASIX) 20 MG tablet  lisinopril (PRINIVIL,ZESTRIL) 40 MG tablet      pantoprazole (PROTONIX) 40 MG tablet      potassium chloride SA (K-DUR,KLOR-CON) 20 MEQ tablet      Probiotic Product (DIGESTIVE ADVANTAGE PO)      spironolactone (ALDACTONE) 25 MG tablet      sucralfate (CARAFATE) 1 g tablet          DISCHARGE INSTRUCTIONS:    Follow with PMD as needed.  If you experience worsening of your admission symptoms, develop shortness of breath, life threatening emergency, suicidal or homicidal thoughts you must seek medical  attention immediately by calling 911 or calling your MD immediately  if symptoms less severe.  You Must read complete instructions/literature along with all the possible adverse reactions/side effects for all the Medicines you take and that have been prescribed to you. Take any new Medicines after you have completely understood and accept all the possible adverse reactions/side effects.   Please note  You were cared for by a hospitalist during your hospital stay. If you have any questions about your discharge medications or the care you received while you were in the hospital after you are discharged, you can call the unit and asked to speak with the hospitalist on call if the hospitalist that took care of you is not available. Once you are discharged, your primary care physician will handle any further medical issues. Please note that NO REFILLS for any discharge medications will be authorized once you are discharged, as it is imperative that you return to your primary care physician (or establish a relationship with a primary care physician if you do not have one) for your aftercare needs so that they can reassess your need for medications and monitor your lab values.    Today   CHIEF COMPLAINT:   Chief Complaint  Patient presents with  . Weakness    HISTORY OF PRESENT ILLNESS:  Krista Randall  is a 82 y.o. female with a known history of Left breast cancer, hypertension, GERD and arthritis was in a usual state of health until the past 2 weeks when the daughters report gradual decline with symptoms including decreased by mouth intake, decreased appetite, unsteady gait, per progressive fatigue and confusion. Today she was acutely confused and began petting a caregiver leg as if it were a dog. Patient's daughters report that she has improved somewhat since she arrived in the emergency department but she is still not at her baseline.  Patient's daughter states that she will live at home alone but  her daughters check in on her. She may not be taking her medications as instructed. And she has certainly had a decrease in her by mouth intake resulting in a weight loss of 4 pounds over the past week.    Patient states that she feels fine, denies all complaints however her mental status is altered.  There has been no recent illness, travel or sick contacts.     VITAL SIGNS:  Blood pressure (!) 131/46, pulse 68, temperature 98 F (36.7 C), resp. rate 18, height 4\' 8"  (1.422 m), weight 43.8 kg (96 lb 8 oz), SpO2 94 %.  I/O:  No intake or output data in the 24 hours ending 01/04/16 1413  PHYSICAL EXAMINATION:   GENERAL:  82 y.o.-year-old patient lying in the bed with no acute distress. Sitting in the bed, complains of back pains EYES: Pupils equal, round, reactive to light and accommodation. No scleral icterus. Extraocular muscles intact.  HEENT: Head atraumatic, normocephalic. Oropharynx  and nasopharynx clear.  NECK:  Supple, no jugular venous distention. No thyroid enlargement, no tenderness.  LUNGS: Normal breath sounds bilaterally, no wheezing, rales,rhonchi or crepitation. No use of accessory muscles of respiration.  CARDIOVASCULAR: S1, S2 normal. No murmurs, rubs, or gallops.  ABDOMEN: Soft, nontender, nondistended. Bowel sounds present. No organomegaly or mass.  EXTREMITIES: 2+ lower extremity and pedal edema, no cyanosis, or clubbing.  NEUROLOGIC: Cranial nerves II through XII are intact. Muscle strength 4/5 in all extremities. Sensation intact. Gait not checked.  PSYCHIATRIC: The patient is alert and oriented x 3.  SKIN: No obvious rash, lesion, or ulcer.   DATA REVIEW:   CBC No results for input(s): WBC, HGB, HCT, PLT in the last 168 hours.  Chemistries  No results for input(s): NA, K, CL, CO2, GLUCOSE, BUN, CREATININE, CALCIUM, MG, AST, ALT, ALKPHOS, BILITOT in the last 168 hours.  Invalid input(s): GFRCGP  Cardiac Enzymes No results for input(s): TROPONINI in the  last 168 hours.  Microbiology Results  No results found for this or any previous visit.  RADIOLOGY:  No results found.  EKG:   Orders placed or performed during the hospital encounter of 12/21/15  . EKG 12-Lead  . EKG 12-Lead      Management plans discussed with the patient, family and they are in agreement.  CODE STATUS:  Code Status History    Date Active Date Inactive Code Status Order ID Comments User Context   12/22/2015 12:47 AM 12/24/2015  8:39 PM DNR [id number]  Harvie Bridge, DO Inpatient   12/21/2015 11:38 PM 12/22/2015 12:47 AM Full Code [id number]  Harvie Bridge, DO Inpatient   05/20/2015  1:59 AM 05/21/2015  7:19 PM Full Code [id number]  Saundra Shelling, MD ED    Questions for Most Recent Historical Code Status (Order [id number])    Question Answer Comment   In the event of cardiac or respiratory ARREST Do not call a "code blue"    In the event of cardiac or respiratory ARREST Do not perform Intubation, CPR, defibrillation or ACLS    In the event of cardiac or respiratory ARREST Use medication by any route, position, wound care, and other measures to relive pain and suffering. May use oxygen, suction and manual treatment of airway obstruction as needed for comfort.         Advance Directive Documentation   Flowsheet Row Most Recent Value  Type of Advance Directive  Healthcare Power of Attorney  Pre-existing out of facility DNR order (yellow form or pink MOST form)  No data  "MOST" Form in Place?  No data      TOTAL TIME TAKING CARE OF THIS PATIENT: 35 minutes.    Vaughan Basta M.D on 01/04/2016 at 2:13 PM  Between 7am to 6pm - Pager - [phone]  After 6pm go to [email or web address] - password [email or web address] [facility]  Office  [phone]  CC: Primary care physician; Dicky Doe, MD   Note: This dictation was prepared with Dragon dictation along with smaller phrase technology. Any transcriptional errors that result from this  process are unintentional.

## 2016-01-05 ENCOUNTER — Ambulatory Visit: Payer: Medicare Other | Admitting: Urology

## 2016-01-07 ENCOUNTER — Other Ambulatory Visit: Payer: Self-pay | Admitting: Family Medicine

## 2016-01-07 DIAGNOSIS — C50912 Malignant neoplasm of unspecified site of left female breast: Secondary | ICD-10-CM

## 2016-01-07 DIAGNOSIS — C7951 Secondary malignant neoplasm of bone: Principal | ICD-10-CM

## 2016-01-07 DIAGNOSIS — R0781 Pleurodynia: Secondary | ICD-10-CM

## 2016-01-07 MED ORDER — MORPHINE SULFATE (CONCENTRATE) 20 MG/ML PO SOLN: 5.0000 mg | ORAL | 0 refills | Status: DC | PRN

## 2016-01-07 NOTE — Telephone Encounter (Signed)
Returned call to [employer] (hospice) regarding patient Krista Randall. Requesting refill on Morphine solution to have on stock for home hospice comfort care patient. Reviewed chart, previously rx per Dr Luan Pulling, this seems to be appropriate refill given hospice patient. Printed requested refill Morphine 20mg /ml solution, take 5-10mg  (0.25 to 0.50 mL) q 1 hr PRN breakthrough moderate / severe pain, dispense 30 mL, 0 refills, dx metastatic breast cancer to bone, left rib pain.  Rx printed, signed, dated 01/07/16, to be faxed to [place]. Hospice to follow-up with [place] if any additional orders or corrections are needed.  [name], [facility] 01/07/2016, 9:59 AM

## 2016-01-07 NOTE — Telephone Encounter (Signed)
Mitzi  From Hospice called [phone]   requesting refill on Morphine

## 2016-01-11 ENCOUNTER — Telehealth: Payer: Self-pay | Admitting: Family Medicine

## 2016-01-11 NOTE — Telephone Encounter (Signed)
Mitzi with Hospice said pt had symptoms of UTI so a UA and culture was done and will be faxed over.  Pt also had 2 to 3 edemas with pitting. Mitzi found some lasix in cabinet and started pt on 20 mg for 3 days for the fluid in legs.  Please call [phone]

## 2016-01-11 NOTE — Telephone Encounter (Signed)
Called [facility]MT[facility]. 

## 2016-01-12 ENCOUNTER — Ambulatory Visit: Payer: Medicare Other | Admitting: Urology

## 2016-01-12 ENCOUNTER — Other Ambulatory Visit: Payer: Self-pay | Admitting: Family Medicine

## 2016-01-12 DIAGNOSIS — N3000 Acute cystitis without hematuria: Secondary | ICD-10-CM

## 2016-01-12 MED ORDER — CEPHALEXIN 250 MG PO CAPS: 250.0000 mg | ORAL_CAPSULE | Freq: Two times a day (BID) | ORAL | 0 refills | Status: AC

## 2016-01-12 NOTE — Progress Notes (Signed)
Spoke with Arnoldo Lenis for hospice regarding pt possible UTI. Attempted to contact Mitzi twice yesterday with no response. Have the UA here today and will treat with Keflex BID for 7 days pending the culture.

## 2016-01-12 NOTE — Telephone Encounter (Signed)
Spoke with Colletta [place] covering RN this AM. See previous note.

## 2016-01-13 ENCOUNTER — Telehealth: Payer: Self-pay | Admitting: Family Medicine

## 2016-01-13 NOTE — Telephone Encounter (Signed)
Called and spoke with hospice- will change ABx to Augmentin BID based on culture.

## 2016-01-19 ENCOUNTER — Ambulatory Visit: Payer: Medicare Other | Admitting: Urology

## 2016-01-20 ENCOUNTER — Ambulatory Visit: Payer: Medicare Other | Admitting: Family Medicine

## 2016-01-26 ENCOUNTER — Other Ambulatory Visit: Payer: Self-pay

## 2016-01-26 DIAGNOSIS — IMO0001 Reserved for inherently not codable concepts without codable children: Secondary | ICD-10-CM

## 2016-02-04 ENCOUNTER — Encounter: Payer: Self-pay | Admitting: Family Medicine

## 2016-02-04 ENCOUNTER — Other Ambulatory Visit: Payer: Self-pay | Admitting: Family Medicine

## 2016-02-04 ENCOUNTER — Telehealth: Payer: Self-pay

## 2016-02-04 DIAGNOSIS — Z515 Encounter for palliative care: Secondary | ICD-10-CM

## 2016-02-04 DIAGNOSIS — C50912 Malignant neoplasm of unspecified site of left female breast: Secondary | ICD-10-CM

## 2016-02-04 DIAGNOSIS — C7951 Secondary malignant neoplasm of bone: Principal | ICD-10-CM

## 2016-02-04 DIAGNOSIS — R0781 Pleurodynia: Secondary | ICD-10-CM

## 2016-02-04 MED ORDER — MORPHINE SULFATE (CONCENTRATE) 10 MG/0.5ML PO SOLN: 5.0000 mg | ORAL | 0 refills | Status: AC | PRN

## 2016-02-04 NOTE — Progress Notes (Signed)
Error

## 2016-02-04 NOTE — Telephone Encounter (Signed)
Krista Randall with Hospice called requesting Morphine concentrated 20mg /ml with directions 0.25 -0.5 every hour PRN for pain or SOB.  Send to Leigh with Hospice patient documented on rx.   Krista Randall also wanted to let provider know that Krista Randall had pain across chest.  With a pain level 10/10.  She was described with a gray color and SOB.  A hospice nurse came out and gave her 2 does of morphine with a little relief from pain.  She reported that patient normally takes 2 does of morphine a day but since chest pain event she has increase to 7 does for the last two days.    Krista Randall reported today that the patient had 3+pitting edema in bilateral legs however, furosemide is helping a little.  She did say that if you wanted to send over a rx for Nitro for the family to use if needed.  Please advise.

## 2016-02-04 NOTE — Telephone Encounter (Signed)
Attempted to call Mitzi from Hospice earlier this afternoon, left voicemail to call back. Sent refill on Morphine printed, signed and faxed from our office earlier today.  Called Mitzi and spoke with her around 1630 today regarding [employer]. She reviewed the recent episode of chest pain that occurred on 10/3, with elevated SBP 170s, pallor and some dyspnea, concerning for cardiac chest pain. Improved with additional morphine. Additionally concern with lower extremity bilateral pitting edema +3 by report, she has had some swelling previously, improved on intermittent lasix, recently she was started back on Lasix 20 for a trial, then inc to Lasix 40 with noticeable improvement, patient stopped this recently for several days and swelling has returned.  Hospice was asking about possibility of NTG for recurrent chest pains. Discussed this and reviewed patient's chart further, established with Dr Nehemiah Massed [facility], she was evaluated >1 year ago for mod mitral regurg and severe tricuspid regurg and pulm HTN, last ECHO 12/2014, with preserved EF 55% and these valvular problems. No significant aggressive management to be done, med management for HTN. No prior history of known CAD or prior NTG treatment.  I advised her that I am not comfortable treating her with NTG given my lack of familiarity with patient (I am covering for Dr Luan Pulling, PCP), and would be concerned about dropping her pressure acutely with the NTG if she has never been on it before. Recommended to use Morphine additional doses if repeat episode, also hospice notified me that it is within her plan of care to go to ED or hospital, but she is DNR. So, if needed they could be evaluated in ED for acute chest pain if emergent, but they should contact hospice first.  Recommended that hospice contact Dr Nehemiah Massed next week to discuss possible NTG use. Otherwise, I will recommend to Dr Luan Pulling to consider Imdur as possible anti-anginal agent  for her. Agree with resuming Lasix 40mg  for edema, family not interested in pursuing further work-up for repeat ECHO, this may be a sign of reduced EF or heart failure.  Lastly, hospice reports that she was "acting funny" and less engaged, aware, sleepier similar to prior UTI, she would be checking a urine on the patient. We will follow-up and will forward this to Dr Luan Pulling for review next week.  [name], [facility] 02/04/2016, 4:37 PM

## 2016-02-06 ENCOUNTER — Encounter: Payer: Self-pay | Admitting: Family Medicine

## 2016-02-07 ENCOUNTER — Telehealth: Payer: Self-pay | Admitting: Family Medicine

## 2016-02-07 NOTE — Telephone Encounter (Signed)
Noted-jh 

## 2016-02-07 NOTE — Telephone Encounter (Signed)
-----   Message from Luciana Axe, NP sent at 02/06/2016  2:17 PM EDT ----- Regarding: Sent in abx Triage called. Pt with acute confusion. Hospice worried about uti. Unable to get sample. Agreed to cover with [facility] over the weekend until sample can be obtained. Sent in ceftin 250mg  once daily for crcl of 27.3 based on her 8/25 BMP. Worsening symptoms to ER and seen by hawkins next week.

## 2016-02-09 ENCOUNTER — Telehealth: Payer: Self-pay | Admitting: *Deleted

## 2016-02-09 NOTE — Telephone Encounter (Signed)
Spoke with patient daughter who states patient not doing well. Urine still has strong odor. Patient seems very agitated. She is not eating or sleeping. She had a fall last night. Hospice nurse was on the way this am to evaluate.

## 2016-02-09 NOTE — Telephone Encounter (Signed)
Left message Dr. Luan Pulling would like to know how patient is doing. He would like to know if urine still has foul odor?

## 2016-02-10 ENCOUNTER — Telehealth: Payer: Self-pay | Admitting: Family Medicine

## 2016-02-23 ENCOUNTER — Ambulatory Visit: Payer: Self-pay

## 2016-03-01 NOTE — Telephone Encounter (Signed)
Noted-jh 

## 2016-03-01 NOTE — Telephone Encounter (Signed)
Error

## 2016-03-01 DEATH — deceased

## 2016-03-10 ENCOUNTER — Ambulatory Visit: Payer: Medicare Other | Admitting: Oncology

## 2016-03-31 ENCOUNTER — Ambulatory Visit: Payer: Medicare Other | Admitting: Oncology

## 2017-01-26 IMAGING — CR DG CHEST 1V PORT
1 series · 1 of 1 positions shown · non-contrast
Comparison: 12/22/2014 and 04/01/2011.

CLINICAL DATA: Abdominal pain with coffee ground emesis. History of
GI bleed.

EXAM:
PORTABLE CHEST 1 VIEW

[portable]
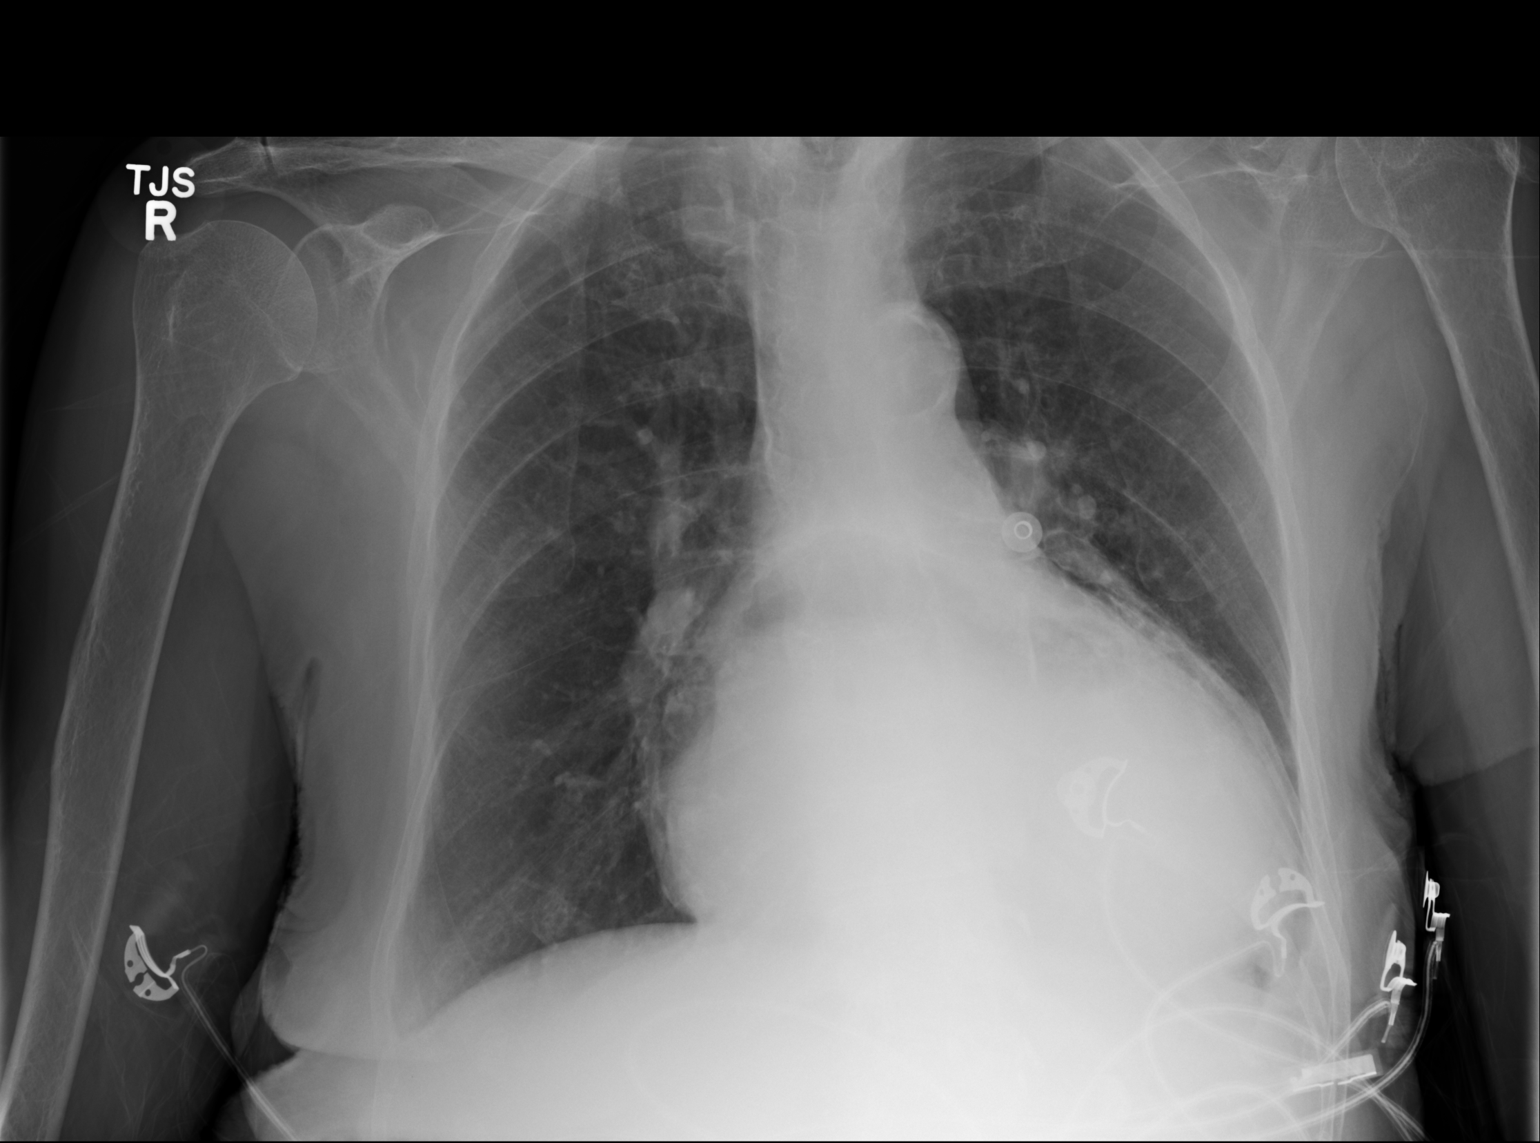

[1 of 1 positions shown; findings below may reference images not displayed]

FINDINGS: 2327 hours. The heart is enlarged. There is a large hiatal hernia
which has mildly enlarged. There is increased left lower lobe
opacity, probably passive atelectasis. The right lung appears clear.
There is no pleural effusion or pneumothorax. The bones appear
unchanged.
IMPRESSION: Enlarging, large hiatal hernia with increasing probable left lower
lobe atelectasis.

## 2017-08-08 IMAGING — DX DG RIBS 2V*L*
3 series · 3 of 3 positions shown · non-contrast
Comparison: 05/19/2015

CLINICAL DATA: Fall 2 months ago with persistent left chest pain,
initial encounter

EXAM:
LEFT RIBS - 2 VIEW

[chest pa]
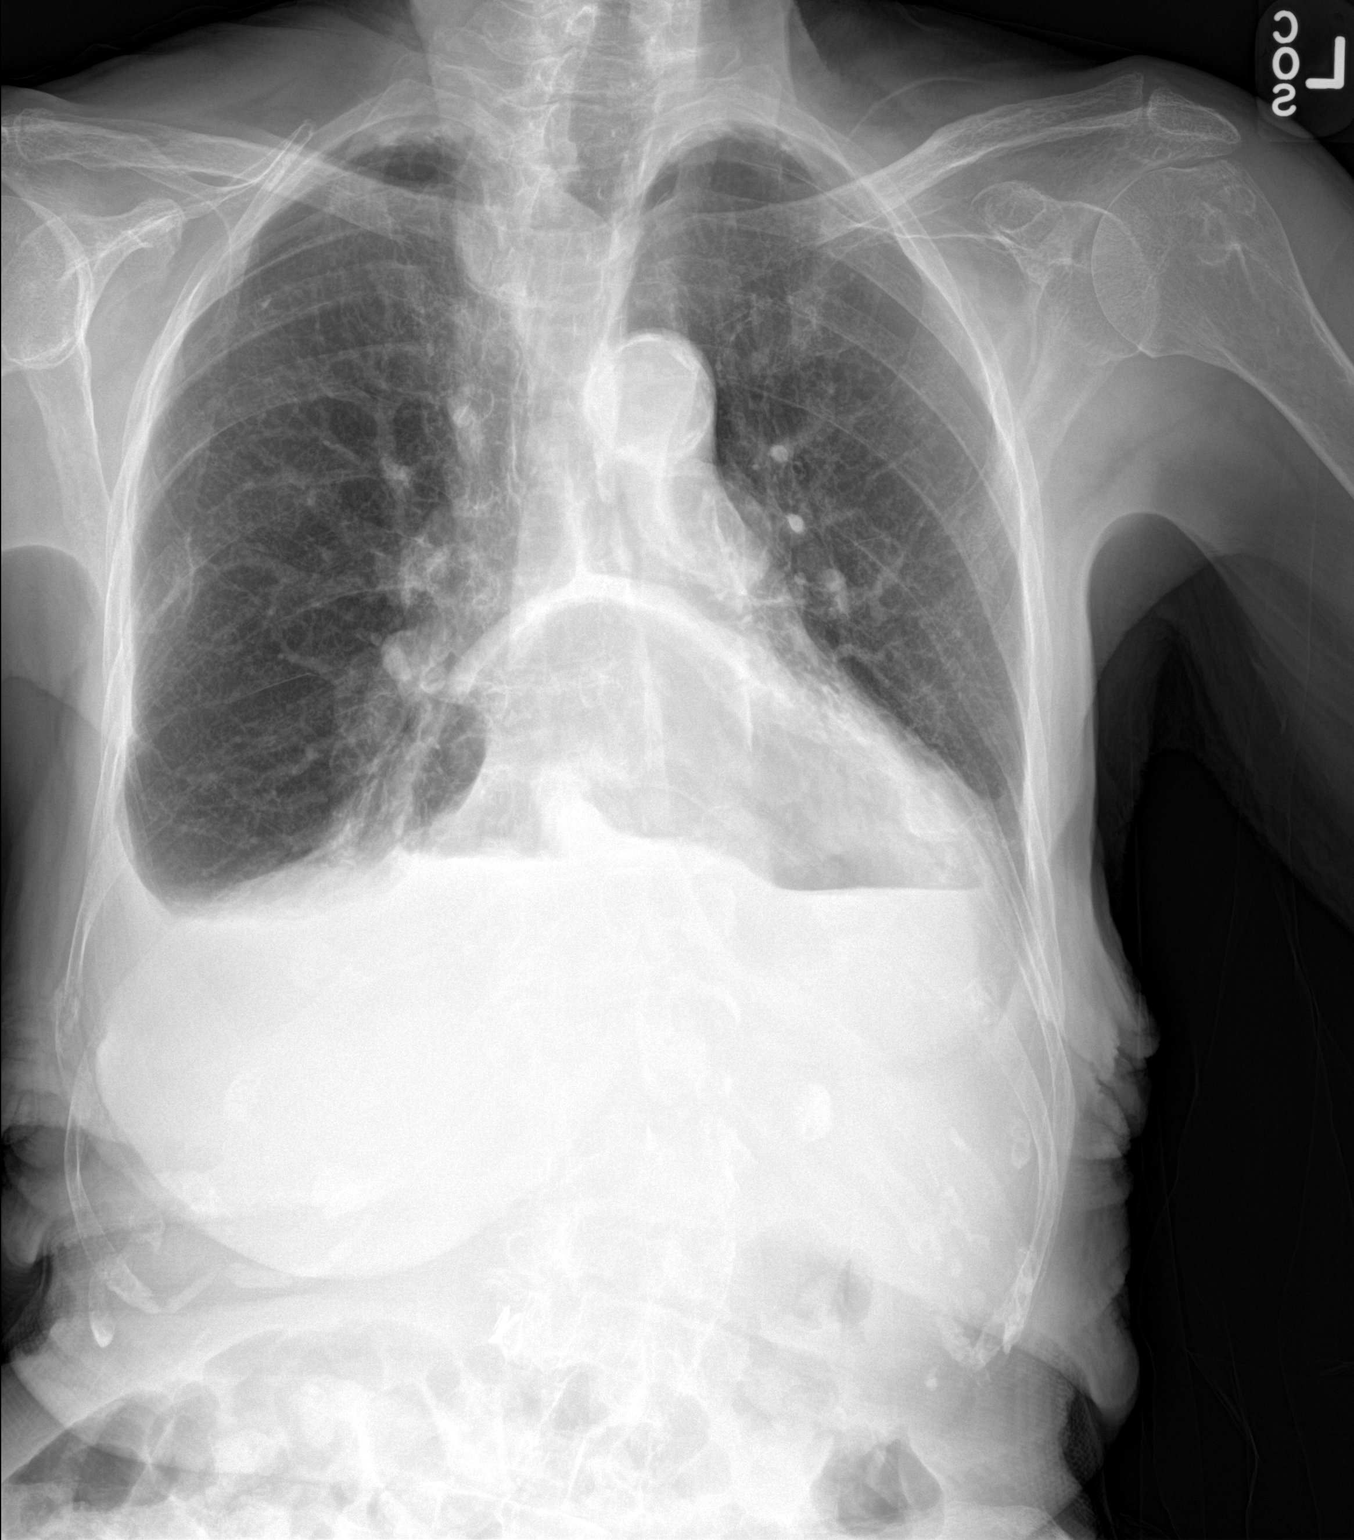

[rib ap]
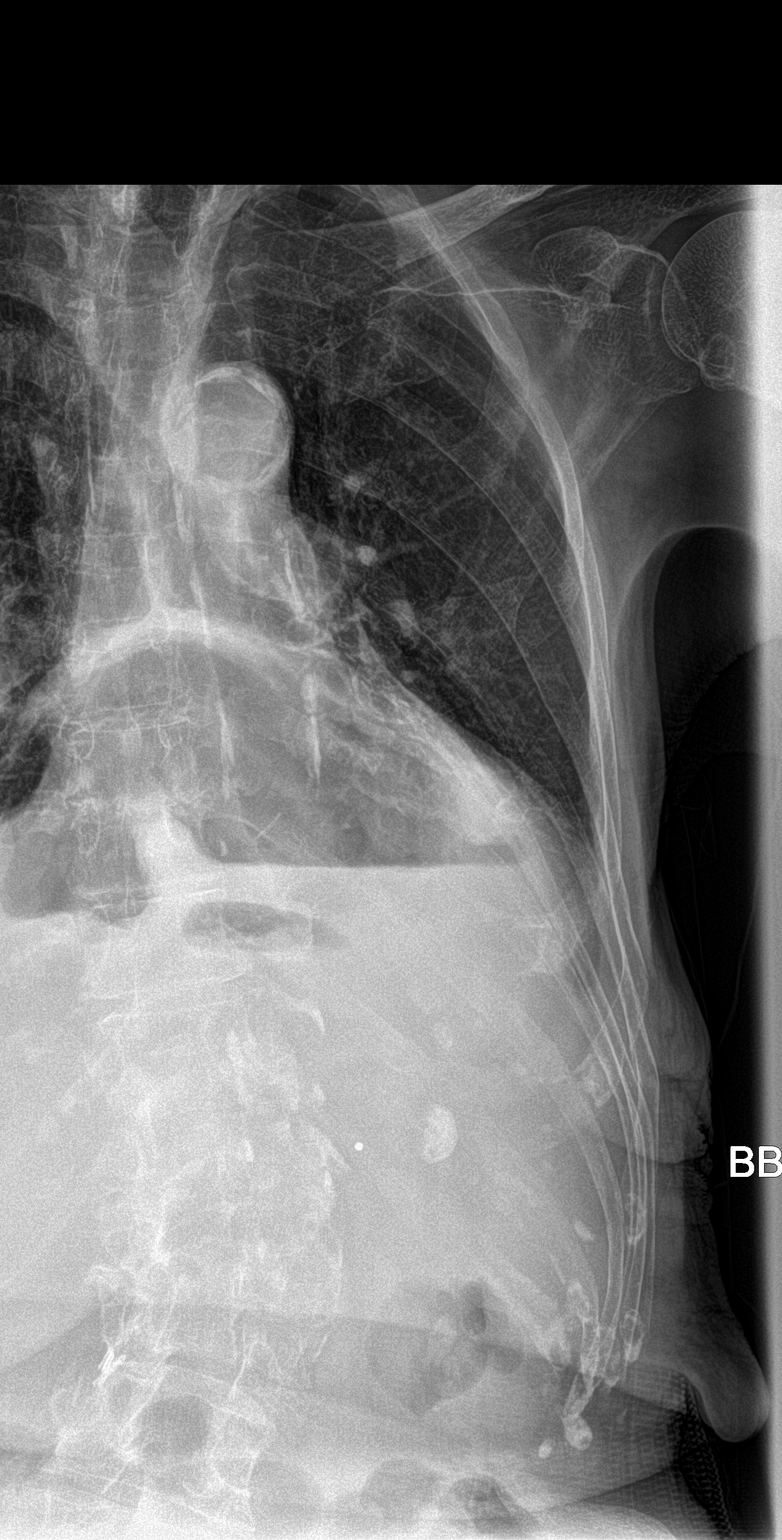

[rib obl]
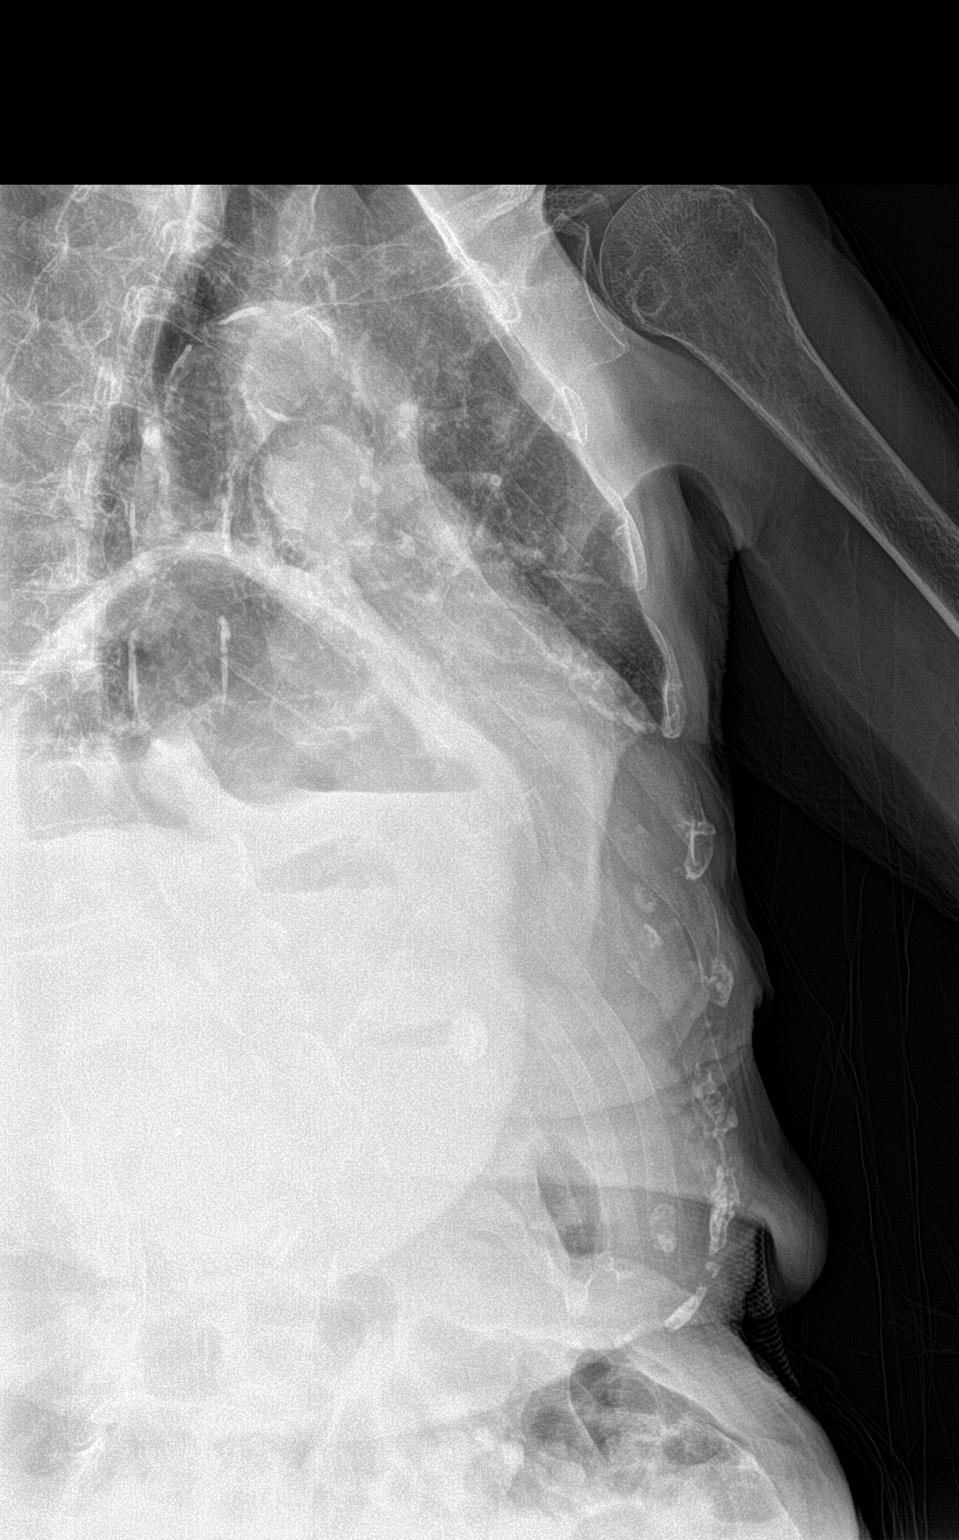

[3 of 3 positions shown; findings below may reference images not displayed]

FINDINGS: Cardiac shadow is within normal limits. A large hiatal hernia is
seen. Aortic calcifications are again noted and stable. New blunting
of the right costophrenic angle is noted which may be related to
small effusion. No definitive rib fracture is noted. No pneumothorax
is seen.
IMPRESSION: New small right pleural effusion.

No acute rib fracture is noted.
